# Patient Record
Sex: Female | Born: 1985 | Race: Black or African American | Hispanic: No | Marital: Single | Smoking: Former smoker
Health system: Southern US, Community
[De-identification: ages and names within clinical notes are randomized; demographics above are authoritative.]

## PROBLEM LIST (undated history)

## (undated) ENCOUNTER — Inpatient Hospital Stay: Payer: Self-pay

## (undated) DIAGNOSIS — Z8659 Personal history of other mental and behavioral disorders: Secondary | ICD-10-CM

## (undated) DIAGNOSIS — Z8619 Personal history of other infectious and parasitic diseases: Secondary | ICD-10-CM

## (undated) DIAGNOSIS — Z87440 Personal history of urinary (tract) infections: Secondary | ICD-10-CM

## (undated) DIAGNOSIS — Z87898 Personal history of other specified conditions: Secondary | ICD-10-CM

## (undated) DIAGNOSIS — A749 Chlamydial infection, unspecified: Secondary | ICD-10-CM

## (undated) HISTORY — DX: Personal history of other specified conditions: Z87.898

## (undated) HISTORY — DX: Personal history of other infectious and parasitic diseases: Z86.19

## (undated) HISTORY — DX: Personal history of other mental and behavioral disorders: Z86.59

## (undated) HISTORY — DX: Chlamydial infection, unspecified: A74.9

## (undated) HISTORY — DX: Personal history of urinary (tract) infections: Z87.440

---

## 2010-08-10 HISTORY — PX: OTHER SURGICAL HISTORY: SHX169

## 2011-11-05 ENCOUNTER — Encounter: Payer: Self-pay | Admitting: Obstetrics & Gynecology

## 2011-11-05 ENCOUNTER — Ambulatory Visit (INDEPENDENT_AMBULATORY_CARE_PROVIDER_SITE_OTHER): Payer: Managed Care, Other (non HMO) | Admitting: Obstetrics & Gynecology

## 2011-11-05 VITALS — BP 123/77 | HR 83 | Ht 64.0 in | Wt 175.0 lb

## 2011-11-05 DIAGNOSIS — Z30430 Encounter for insertion of intrauterine contraceptive device: Secondary | ICD-10-CM

## 2011-11-05 DIAGNOSIS — Z3009 Encounter for other general counseling and advice on contraception: Secondary | ICD-10-CM

## 2011-11-05 DIAGNOSIS — Z975 Presence of (intrauterine) contraceptive device: Secondary | ICD-10-CM

## 2011-11-05 DIAGNOSIS — Z01812 Encounter for preprocedural laboratory examination: Secondary | ICD-10-CM

## 2011-11-05 DIAGNOSIS — R635 Abnormal weight gain: Secondary | ICD-10-CM

## 2011-11-05 LAB — POCT URINE PREGNANCY: Preg Test, Ur: NEGATIVE

## 2011-11-05 NOTE — Progress Notes (Signed)
  Subjective:    Patient ID: Janice Goodman, female    DOB: 1986-03-17, 26 y.o.   MRN: [id number]  HPI  Janice Goodman is a 26 yo S AA G0 who is here to discuss birth control. She has used depo provera off and on for the past 4 years. She has gained 30 pounds in the past year. Her depo is due today and she would like to change to [place].  Review of Systems She has been monogamous for 5 months. 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 She has completed the Gardasil vaccinations. Her pap is due.    Objective:   Physical Exam  NSSA, NT, mobile, no adnexal masses  UPT negative, questions answered, consent signed, cervix prepped with betadine, anterior lip of cervix grasped with single tooth tenaculum, Mirena placed without difficulty, strings cut to 3 cm. She tolerated the procedure well.    Assessment & Plan:  Birth control- Mirena Weight gain- check TSH Preventative care- schedule annual 1 month

## 2011-11-05 NOTE — Progress Notes (Signed)
Last Depo August 17, 2011,  She did have a physical with pap in October but there was insufficient cells, so she does need a repeat.  She would rather not do that today.

## 2011-11-05 NOTE — Progress Notes (Signed)
Addended by: Barbara Cower on: 11/05/2011 11:29 AM   Modules accepted: Orders

## 2011-12-01 ENCOUNTER — Ambulatory Visit (INDEPENDENT_AMBULATORY_CARE_PROVIDER_SITE_OTHER): Payer: Managed Care, Other (non HMO) | Admitting: Obstetrics & Gynecology

## 2011-12-01 ENCOUNTER — Encounter: Payer: Self-pay | Admitting: Obstetrics & Gynecology

## 2011-12-01 VITALS — BP 121/67 | HR 85 | Ht 64.0 in | Wt 181.0 lb

## 2011-12-01 DIAGNOSIS — Z124 Encounter for screening for malignant neoplasm of cervix: Secondary | ICD-10-CM

## 2011-12-01 DIAGNOSIS — Z01419 Encounter for gynecological examination (general) (routine) without abnormal findings: Secondary | ICD-10-CM

## 2011-12-01 DIAGNOSIS — Z113 Encounter for screening for infections with a predominantly sexual mode of transmission: Secondary | ICD-10-CM

## 2011-12-01 DIAGNOSIS — Z0000 Encounter for general adult medical examination without abnormal findings: Secondary | ICD-10-CM

## 2011-12-01 NOTE — Progress Notes (Signed)
Patient ID: Mechele Dawley, female   DOB: November 22, 1985, 26 y.o.   MRN: [id number] Subjective:    Jady Braggs is a 26 y.o. female who presents for an annual exam. She has had some cramping and irregular bleeding since getting the Mirena placed last month. She has not tried IBU yet but is willing to try this. The patient is sexually active. GYN screening history: last pap: was normal. The patient wears seatbelts: yes. The patient participates in regular exercise: yes. Has the patient ever been transfused or tattooed?: yes  The patient reports that there is not domestic violence in her life.   Menstrual History: OB History    Grav Para Term Preterm Abortions TAB SAB Ect Mult Living                  Menarche age: 31 Patient's last menstrual period was 08/13/2011.    The following portions of the patient's history were reviewed and updated as appropriate: allergies, current medications, past family history, past medical history, past social history, past surgical history and problem list.  Review of Systems A comprehensive review of systems was negative.    Objective:    BP 121/67  Pulse 85  Ht 5\' 4"  (1.626 m)  Wt 181 lb (82.101 kg)  BMI 31.07 kg/m2  LMP 08/13/2011  General Appearance:    Alert, cooperative, no distress, appears stated age  Head:    Normocephalic, without obvious abnormality, atraumatic  Eyes:    PERRL, conjunctiva/corneas clear, EOM's intact, fundi    benign, both eyes  Ears:    Normal TM's and external ear canals, both ears  Nose:   Nares normal, septum midline, mucosa normal, no drainage    or sinus tenderness  Throat:   Lips, mucosa, and tongue normal; teeth and gums normal  Neck:   Supple, symmetrical, trachea midline, no adenopathy;    thyroid:  no enlargement/tenderness/nodules; no carotid   bruit or JVD  Back:     Symmetric, no curvature, ROM normal, no CVA tenderness  Lungs:     Clear to auscultation bilaterally, respirations unlabored  Chest Wall:    No  tenderness or deformity   Heart:    Regular rate and rhythm, S1 and S2 normal, no murmur, rub   or gallop  Breast Exam:    No tenderness, masses, or nipple abnormality  Abdomen:     Soft, non-tender, bowel sounds active all four quadrants,    no masses, no organomegaly  Genitalia:    Normal female without lesion, discharge or tenderness  Rectal:    Normal tone, normal prostate, no masses or tenderness;   guaiac negative stool  Extremities:   Extremities normal, atraumatic, no cyanosis or edema  Pulses:   2+ and symmetric all extremities  Skin:   Skin color, texture, turgor normal, no rashes or lesions  Lymph nodes:   Cervical, supraclavicular, and axillary nodes normal  Neurologic:   CNII-XII intact, normal strength, sensation and reflexes    throughout  .    Assessment:    Healthy female exam.    Plan:     Pap smear.

## 2012-05-19 ENCOUNTER — Telehealth: Payer: Self-pay | Admitting: *Deleted

## 2012-05-19 DIAGNOSIS — B379 Candidiasis, unspecified: Secondary | ICD-10-CM

## 2012-05-19 MED ORDER — FLUCONAZOLE 150 MG PO TABS: 150.0000 mg | ORAL_TABLET | Freq: Once | ORAL | Status: DC

## 2012-05-19 NOTE — Telephone Encounter (Signed)
Patient is having a thick clumpy discharge and irritation.  She would like Diflucan called in for her.

## 2012-05-31 ENCOUNTER — Encounter: Payer: Self-pay | Admitting: Obstetrics & Gynecology

## 2012-05-31 ENCOUNTER — Ambulatory Visit (INDEPENDENT_AMBULATORY_CARE_PROVIDER_SITE_OTHER): Payer: Managed Care, Other (non HMO) | Admitting: Obstetrics & Gynecology

## 2012-05-31 VITALS — BP 98/70 | HR 77 | Ht 63.0 in | Wt 192.0 lb

## 2012-05-31 DIAGNOSIS — R102 Pelvic and perineal pain: Secondary | ICD-10-CM

## 2012-05-31 DIAGNOSIS — Z23 Encounter for immunization: Secondary | ICD-10-CM

## 2012-05-31 DIAGNOSIS — N949 Unspecified condition associated with female genital organs and menstrual cycle: Secondary | ICD-10-CM

## 2012-05-31 DIAGNOSIS — E669 Obesity, unspecified: Secondary | ICD-10-CM

## 2012-05-31 NOTE — Progress Notes (Signed)
  Subjective:    Patient ID: Janice Goodman, female    DOB: 20-Jan-1986, 26 y.o.   MRN: [id number]  HPI  26 yo S lady who had a Mirena placed 3/13. She comes today with several month's h/o pelvic discomfort. She tells me that she had had ovarian cysts in the past and is concerned that this may be the cause of her pain. Her other complaint is that of weight gain. Her TSH was normal recently.  Review of Systems She has monthly periods lasting 2 days.    Objective:   Physical Exam        Assessment & Plan:  Pelvic discomfort- check cervical cultures and a pelvic u/s. She would like to have a nutritionist consult.

## 2012-06-01 ENCOUNTER — Other Ambulatory Visit: Payer: Self-pay | Admitting: Obstetrics & Gynecology

## 2012-06-10 ENCOUNTER — Ambulatory Visit (HOSPITAL_COMMUNITY)
Admission: RE | Admit: 2012-06-10 | Discharge: 2012-06-10 | Disposition: A | Discharge status: Home / Self Care | Payer: Managed Care, Other (non HMO) | Source: Ambulatory Visit | Attending: Obstetrics & Gynecology | Admitting: Obstetrics & Gynecology

## 2012-06-10 DIAGNOSIS — N83209 Unspecified ovarian cyst, unspecified side: Secondary | ICD-10-CM | POA: Insufficient documentation

## 2012-06-10 DIAGNOSIS — R102 Pelvic and perineal pain: Secondary | ICD-10-CM

## 2012-06-10 DIAGNOSIS — N949 Unspecified condition associated with female genital organs and menstrual cycle: Secondary | ICD-10-CM | POA: Insufficient documentation

## 2012-06-10 DIAGNOSIS — Z30431 Encounter for routine checking of intrauterine contraceptive device: Secondary | ICD-10-CM | POA: Insufficient documentation

## 2012-06-16 ENCOUNTER — Ambulatory Visit (INDEPENDENT_AMBULATORY_CARE_PROVIDER_SITE_OTHER): Payer: Managed Care, Other (non HMO) | Admitting: Obstetrics & Gynecology

## 2012-06-16 ENCOUNTER — Encounter: Payer: Self-pay | Admitting: Obstetrics & Gynecology

## 2012-06-16 VITALS — BP 125/82 | HR 90 | Ht 64.0 in | Wt 192.0 lb

## 2012-06-16 DIAGNOSIS — T8389XA Other specified complication of genitourinary prosthetic devices, implants and grafts, initial encounter: Secondary | ICD-10-CM

## 2012-06-16 DIAGNOSIS — N898 Other specified noninflammatory disorders of vagina: Secondary | ICD-10-CM

## 2012-06-16 DIAGNOSIS — N949 Unspecified condition associated with female genital organs and menstrual cycle: Secondary | ICD-10-CM

## 2012-06-16 DIAGNOSIS — T8332XA Displacement of intrauterine contraceptive device, initial encounter: Secondary | ICD-10-CM

## 2012-06-16 DIAGNOSIS — N738 Other specified female pelvic inflammatory diseases: Secondary | ICD-10-CM

## 2012-06-16 DIAGNOSIS — N739 Female pelvic inflammatory disease, unspecified: Secondary | ICD-10-CM

## 2012-06-16 DIAGNOSIS — Z30432 Encounter for removal of intrauterine contraceptive device: Secondary | ICD-10-CM

## 2012-06-16 DIAGNOSIS — R102 Pelvic and perineal pain: Secondary | ICD-10-CM

## 2012-06-16 LAB — POCT URINALYSIS DIPSTICK
Bilirubin, UA: NEGATIVE
Glucose, UA: NEGATIVE
Leukocytes, UA: NEGATIVE
Nitrite, UA: NEGATIVE
pH, UA: 6

## 2012-06-16 MED ORDER — CEFTRIAXONE SODIUM 1 G IJ SOLR
250.0000 mg | Freq: Once | INTRAMUSCULAR | Status: AC
  Administered 2012-06-16: 250 mg via INTRAMUSCULAR

## 2012-06-16 MED ORDER — FLUCONAZOLE 150 MG PO TABS: 150.0000 mg | ORAL_TABLET | Freq: Once | ORAL | Status: DC

## 2012-06-16 MED ORDER — METRONIDAZOLE 500 MG PO TABS: 500.0000 mg | ORAL_TABLET | Freq: Two times a day (BID) | ORAL | Status: AC

## 2012-06-16 MED ORDER — LIDOCAINE HCL 1 % IJ SOLN: 250.0000 mg | Freq: Once | INTRAMUSCULAR | Status: DC

## 2012-06-16 MED ORDER — DOXYCYCLINE HYCLATE 100 MG PO CAPS: 100.0000 mg | ORAL_CAPSULE | Freq: Two times a day (BID) | ORAL | Status: DC

## 2012-06-16 NOTE — Progress Notes (Signed)
History:  26 y.o. G0 here today for acute pelvic pain. She had Mirena placed in 11/05/11 and was having a lot of pain after placement, worsening in the last few weeks. She underwent ultrasound on 06/10/12 that showed a malpositioned IUD.  She is here for evaluation and management of her pelvic pain and malpositioned IUD. Reports having fevers and chills, and severe lower abdominal and pelvic pain at home. No GI/GU symptoms.  The following portions of the patient's history were reviewed and updated as appropriate: allergies, current medications, past family history, past medical history, past social history, past surgical history and problem list.  Review of Systems:  Pertinent items are noted in HPI.  Objective:  Physical Exam Blood pressure 125/82, pulse 90, height 5\' 4"  (1.626 m), weight 192 lb (87.091 kg). Temp 98.7 (took NSAID at home) Gen: NAD Abd: Soft, moderately tender to palpation, no rebound or guarding, nondistended Pelvic: Normal appearing external genitalia; normal appearing vaginal mucosa and cervix. IUD strings visualized.  Copious white discharge, wet prep sample obtained.  Small uterus, no other palpable masses, moderate uterine tenderness  IUD Removal  Patient was in the dorsal lithotomy position, normal external genitalia was noted.  A speculum was placed in the patient's vagina, normal discharge was noted, no lesions. The multiparous cervix was visualized, no lesions, no abnormal discharge;  and the cervix was swabbed with Betadine using scopettes. The strings of the IUD were grasped and pulled using ring forceps.  The IUD was successfully removed in its entirety.  Patient tolerated the procedure well.    Labs and Imaging 06/17/12 UA negative  06/10/2012  TRANSABDOMINAL AND TRANSVAGINAL ULTRASOUND OF PELVIS Clinical Data: Pelvic pain.  History of ovarian cyst.  IUD.    Technique:  Both transabdominal and transvaginal ultrasound examinations of the pelvis were performed.   Transabdominal technique was performed for global imaging of the pelvis including uterus, ovaries, adnexal regions, and pelvic cul-de-sac.  It was necessary to proceed with endovaginal exam following the transabdominal exam to visualize the IUD and left ovarian cyst.  Comparison:  None.  Findings: Uterus:  6.9 x 2.9 x 4.5 cm.  No fibroids or other uterine mass identified.  Endometrium: Double layer thickness measures 3 mm transvaginally. An IUD is seen in the endocervical canal and lower portion of the endometrial cavity, with probable penetration of the side arms into the myometrium in the lower uterine segment.  Right ovary: 3.7 x 2.2 x 1.9 cm.  Normal appearance.  Left ovary: 4.1 x 3.1 x 4.1 cm.  A simple appearing cyst measuring 3.5 cm in maximum diameter, which has benign features and most likely represents a physiologic ovarian cyst.  Other Findings:  No free fluid  IMPRESSION:  1.  Malpositioned IUD in the lower uterine segment. 2. 3.5 cm left ovarian cyst with benign features.  No further imaging followup is required in a reproductive age female. This recommendation follows the consensus statement:  Management of Asymptomatic Ovarian and Other Adnexal Cysts Imaged at [place]:  Society of Radiologists in Ultrasound Consensus Conference Statement. Radiology 2010; [phone].   Original Report Authenticated By: Myles Rosenthal, M.D.     Assessment & Plan:  IUD removed. Sample of Lo Lo-estrin given for contraception for now, will return in a few weeks for further discussion Treated for pelvic infection using Rocephin 250mg  IM x 1, 14 day course of Doxycyline and Metronidazole Also gave Diflucan Rx, she gets yeast infections after antibiotic regimens Fever and pain precautions reviewed Return to clinic  in 4 weeks for further management or earlier if needed.

## 2012-06-16 NOTE — Patient Instructions (Signed)
Return to clinic for any scheduled appointments or for any gynecologic concerns as needed.   

## 2012-06-16 NOTE — Addendum Note (Signed)
Addended by: Barbara Cower on: 06/16/2012 01:38 PM   Modules accepted: Orders

## 2012-06-16 NOTE — Progress Notes (Signed)
Patient started hurting this morning all over, achey feeling, feels chills, having significant lower pelvic cramping more in the middle.

## 2012-06-17 ENCOUNTER — Ambulatory Visit (HOSPITAL_COMMUNITY): Payer: Managed Care, Other (non HMO)

## 2012-06-17 LAB — WET PREP, GENITAL: Yeast Wet Prep HPF POC: NONE SEEN

## 2012-06-30 ENCOUNTER — Ambulatory Visit: Payer: Managed Care, Other (non HMO) | Admitting: Family Medicine

## 2012-07-11 ENCOUNTER — Ambulatory Visit: Payer: Managed Care, Other (non HMO) | Admitting: Family Medicine

## 2012-09-02 ENCOUNTER — Ambulatory Visit (INDEPENDENT_AMBULATORY_CARE_PROVIDER_SITE_OTHER): Payer: Managed Care, Other (non HMO) | Admitting: *Deleted

## 2012-09-02 DIAGNOSIS — Z3049 Encounter for surveillance of other contraceptives: Secondary | ICD-10-CM

## 2012-09-02 DIAGNOSIS — Z3042 Encounter for surveillance of injectable contraceptive: Secondary | ICD-10-CM

## 2012-09-02 MED ORDER — MEDROXYPROGESTERONE ACETATE 150 MG/ML IM SUSP
150.0000 mg | Freq: Once | INTRAMUSCULAR | Status: AC
  Administered 2012-09-02: 150 mg via INTRAMUSCULAR

## 2012-09-02 MED ORDER — PANTOPRAZOLE SODIUM 40 MG PO TBEC: 40.0000 mg | DELAYED_RELEASE_TABLET | Freq: Every day | ORAL | Status: DC

## 2012-09-02 NOTE — Progress Notes (Signed)
Patient would like to return to Depo for contraception.  She had her period last week and has not had intercourse in more than two weeks.  She is otherwise doing well.

## 2012-11-28 ENCOUNTER — Ambulatory Visit: Payer: Managed Care, Other (non HMO) | Admitting: *Deleted

## 2012-12-29 IMAGING — US US TRANSVAGINAL NON-OB
1 series · 13 of 25 positions shown · non-contrast
Comparison: None.

CLINICAL DATA: Pelvic pain.  History of ovarian cyst.  IUD.

TRANSABDOMINAL AND TRANSVAGINAL ULTRASOUND OF PELVIS
TECHNIQUE: Both transabdominal and transvaginal ultrasound
examinations of the pelvis were performed.  Transabdominal
technique was performed for global imaging of the pelvis including
uterus, ovaries, adnexal regions, and pelvic cul-de-sac.
It was necessary to proceed with endovaginal exam following the
transabdominal exam to visualize the IUD and left ovarian cyst..

[Series 1: us pelvis complete · 13 of 92 slices shown]
[im 1/92]
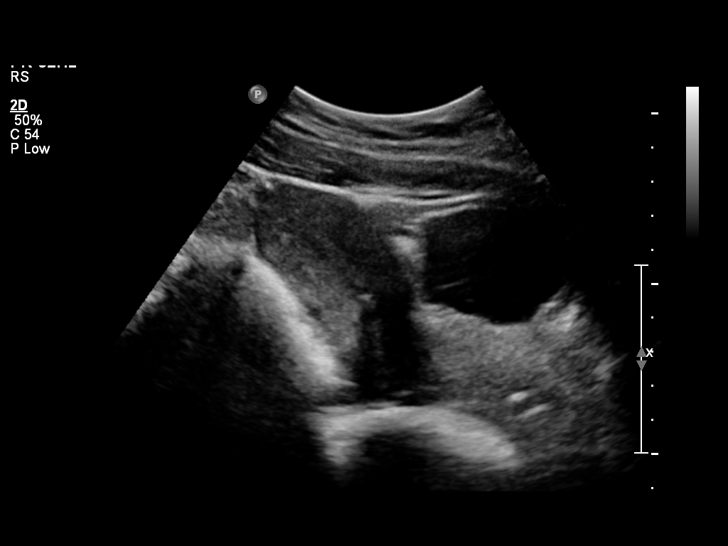
[im 8/92]
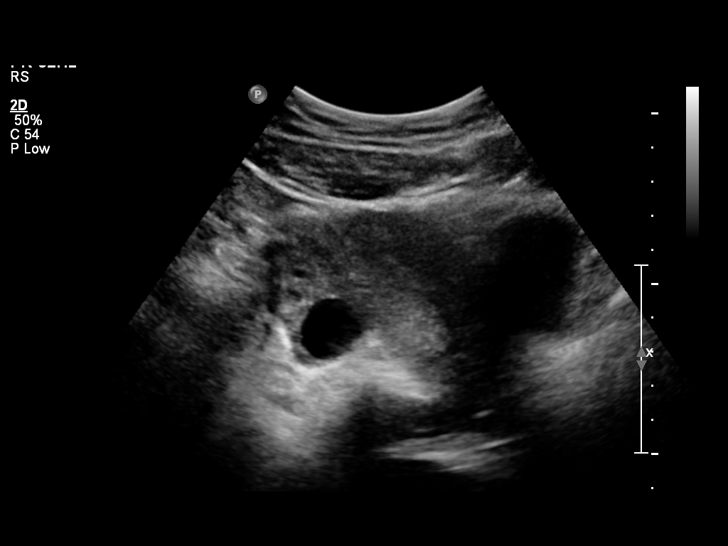
[im 16/92]
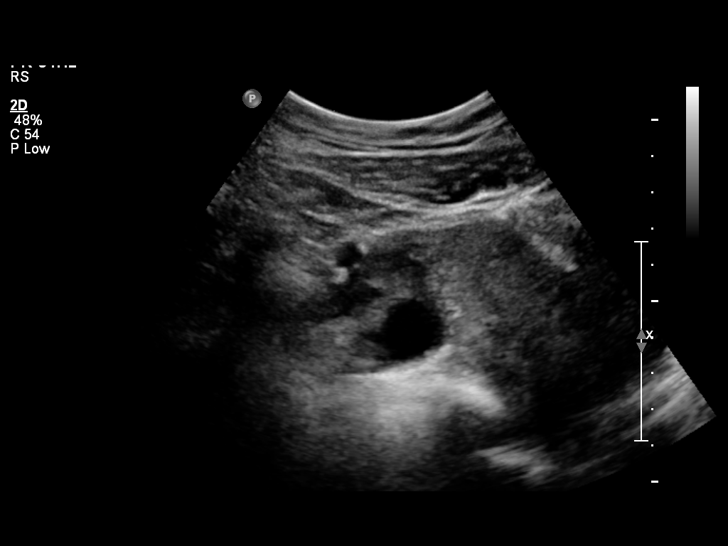
[im 23/92]
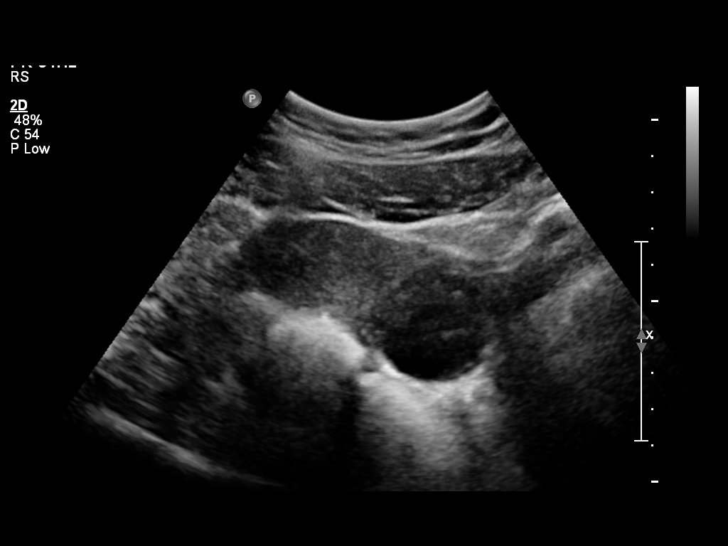
[im 31/92]
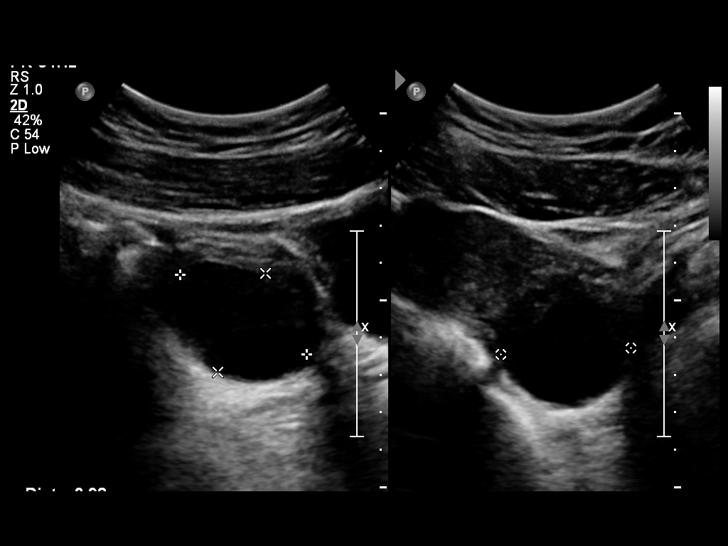
[im 38/92]
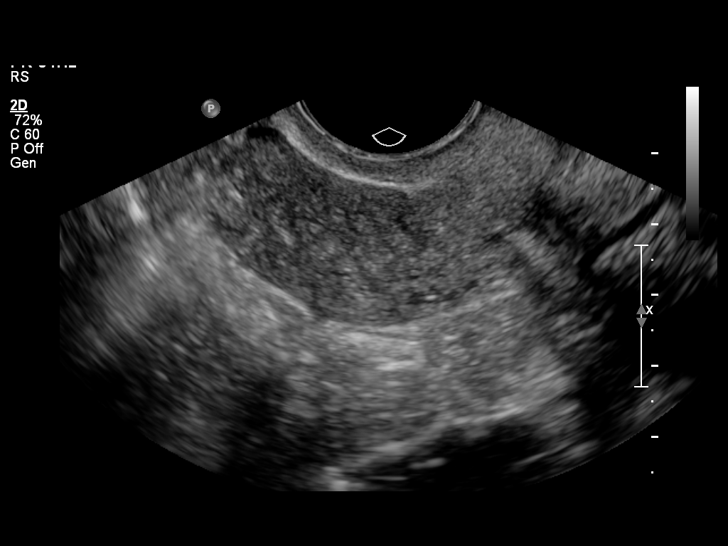
[im 46/92]
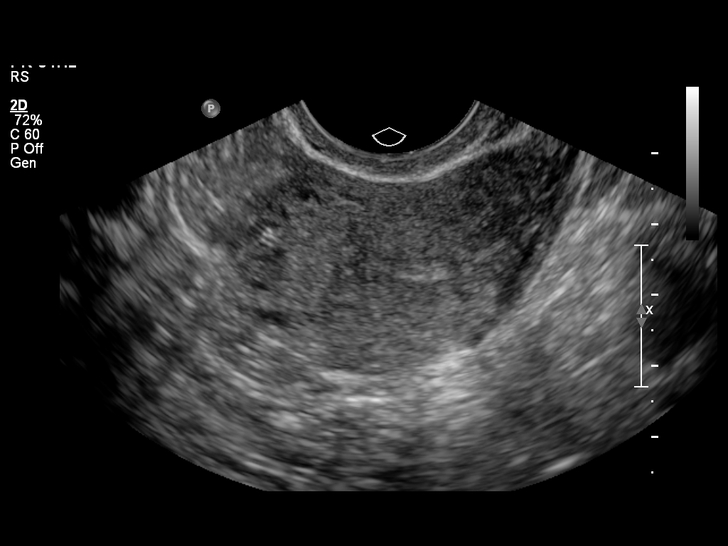
[im 54/92]
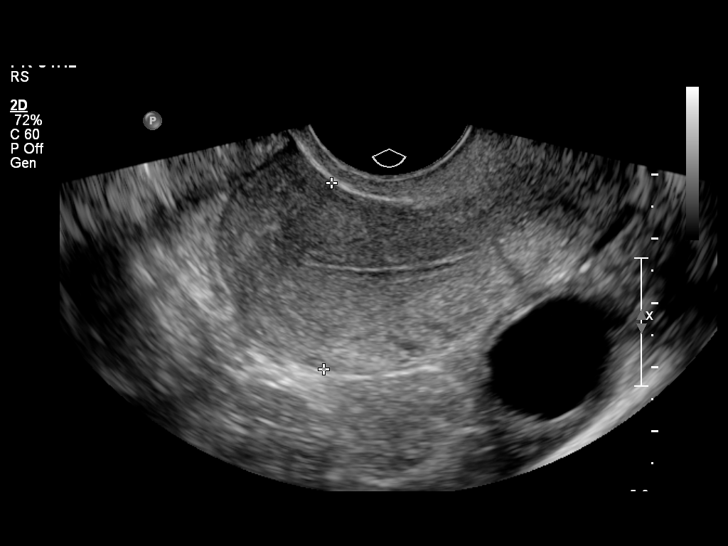
[im 61/92]
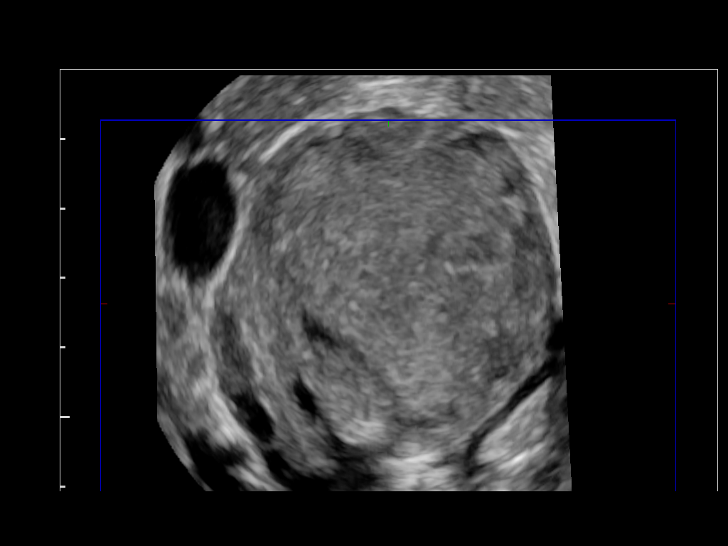
[im 69/92]
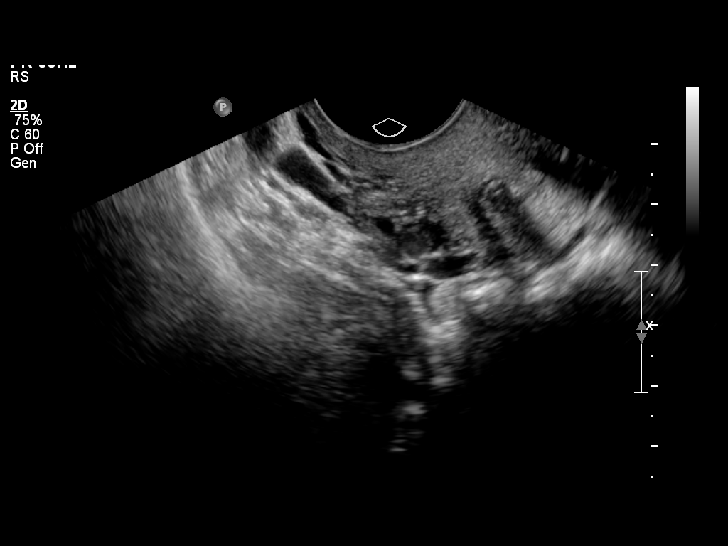
[im 76/92]
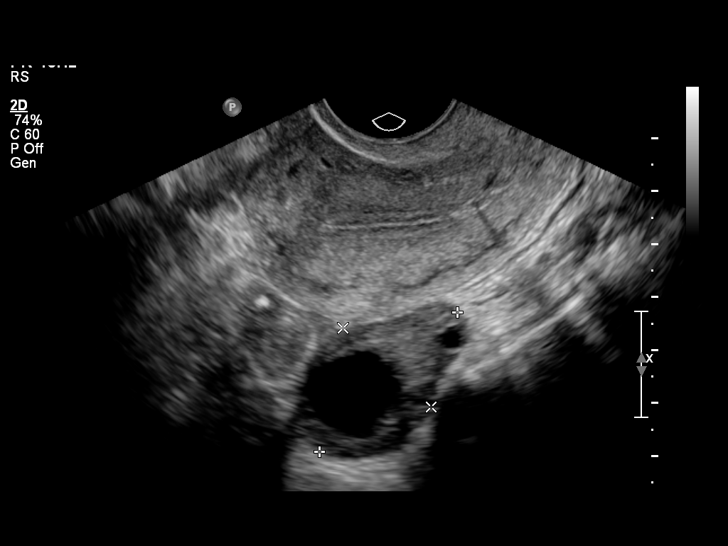
[im 84/92]
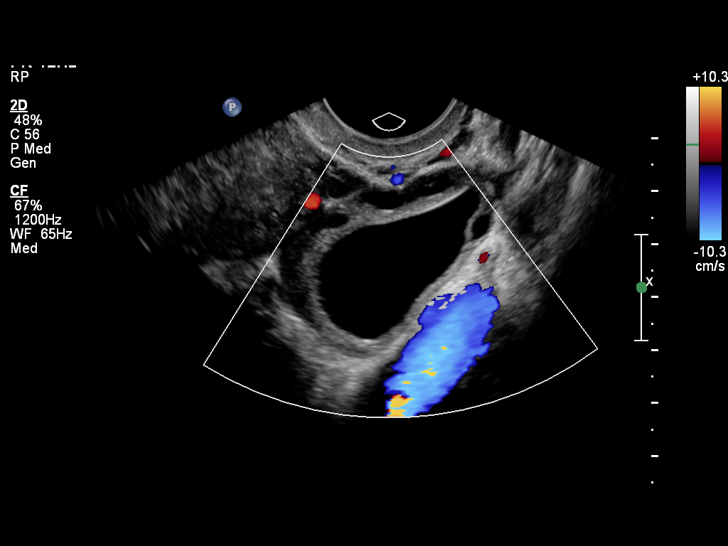
[im 92/92]
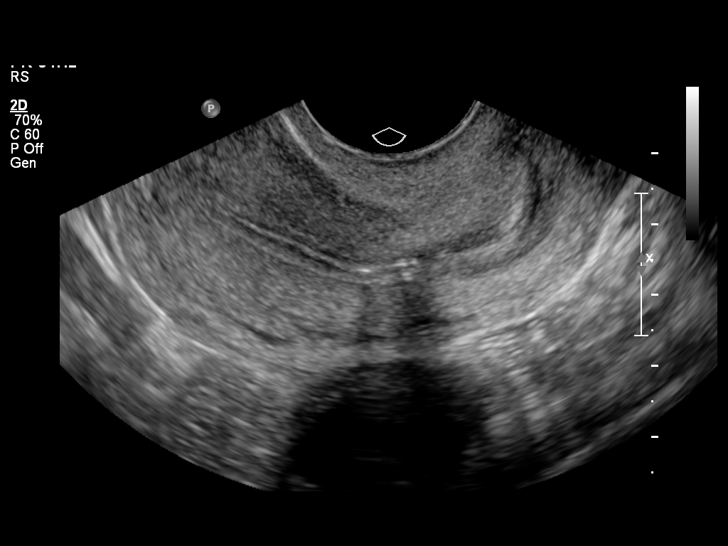

[13 of 25 positions shown; findings below may reference images not displayed]

FINDINGS: Uterus:  6.9 x 2.9 x 4.5 cm.  No fibroids or other uterine mass
identified.

Endometrium: Double layer thickness measures 3 mm transvaginally.
An IUD is seen in the endocervical canal and lower portion of the
endometrial cavity, with probable penetration of the side arms into
the myometrium in the lower uterine segment.

Right ovary: 3.7 x 2.2 x 1.9 cm.  Normal appearance.

Left ovary: 4.1 x 3.1 x 4.1 cm.  A simple appearing cyst measuring
3.5 cm in maximum diameter, which has benign features and most
likely represents a physiologic ovarian cyst.

Other Findings:  No free fluid
IMPRESSION: 1.  Malpositioned IUD in the lower uterine segment.
2. 3.5 cm left ovarian cyst with benign features.  No further
imaging followup is required in a reproductive age female. This
recommendation follows the consensus statement:  Management of
Asymptomatic Ovarian and Other Adnexal Cysts Imaged at [place]:  Society
of Radiologists in Ultrasound Consensus Conference Statement.

## 2013-03-22 ENCOUNTER — Encounter: Payer: Self-pay | Admitting: Obstetrics and Gynecology

## 2013-03-22 ENCOUNTER — Ambulatory Visit (INDEPENDENT_AMBULATORY_CARE_PROVIDER_SITE_OTHER): Payer: Managed Care, Other (non HMO) | Admitting: Obstetrics and Gynecology

## 2013-03-22 VITALS — BP 116/77 | HR 76 | Ht 64.0 in | Wt 192.0 lb

## 2013-03-22 DIAGNOSIS — N760 Acute vaginitis: Secondary | ICD-10-CM

## 2013-03-22 MED ORDER — METRONIDAZOLE 500 MG PO TABS: 500.0000 mg | ORAL_TABLET | Freq: Two times a day (BID) | ORAL | Status: DC

## 2013-03-22 NOTE — Progress Notes (Signed)
Patient ID: Janice Goodman, female   DOB: February 09, 1986, 27 y.o.   MRN: [id number] 27 yo G0 presenting today for evaluation of vaginal discharge with odor which has been present for the past 3-4 weeks. Patient is not sexually active and is not concerned about STD. Patient is otherwise doing well  GENERAL: Well-developed, well-nourished female in no acute distress.  ABDOMEN: Soft, nontender, nondistended. PELVIC: Normal external female genitalia. Vagina is pink and rugated. Thin grey discharge. Normal appearing cervix. Uterus is normal in size. No adnexal mass or tenderness. EXTREMITIES: No cyanosis, clubbing, or edema, 2+ distal pulses.  A/P 27 yo with vaginitis - Wet prep collected - Will treat for presumed BV with Flagyl - RTC prn or for annual exam

## 2013-03-24 LAB — WET PREP, GENITAL

## 2013-04-07 ENCOUNTER — Telehealth: Payer: Self-pay | Admitting: *Deleted

## 2013-04-07 DIAGNOSIS — J020 Streptococcal pharyngitis: Secondary | ICD-10-CM

## 2013-04-07 MED ORDER — AZITHROMYCIN 250 MG PO TABS: 250.0000 mg | ORAL_TABLET | Freq: Every day | ORAL | Status: DC

## 2013-04-07 NOTE — Telephone Encounter (Signed)
Patient is having sore throat, fever, chills and nausea for one week and it is not getting better.  She currently does not have a primary care and has been to both [facility] in this area and they would not see her until next week.  She will get established with a primary care so she will be able to be seen sooner for issues like this, for now we will call in medication for her and she will follow up next week to let us know how she is feeling.

## 2013-04-25 ENCOUNTER — Encounter: Payer: Self-pay | Admitting: Family Medicine

## 2013-04-25 ENCOUNTER — Ambulatory Visit (INDEPENDENT_AMBULATORY_CARE_PROVIDER_SITE_OTHER): Payer: Managed Care, Other (non HMO) | Admitting: Family Medicine

## 2013-04-25 VITALS — BP 118/60 | HR 95 | Temp 98.9°F | Ht 63.0 in | Wt 197.2 lb

## 2013-04-25 DIAGNOSIS — F32A Depression, unspecified: Secondary | ICD-10-CM | POA: Insufficient documentation

## 2013-04-25 DIAGNOSIS — F329 Major depressive disorder, single episode, unspecified: Secondary | ICD-10-CM

## 2013-04-25 DIAGNOSIS — F3289 Other specified depressive episodes: Secondary | ICD-10-CM

## 2013-04-25 DIAGNOSIS — N946 Dysmenorrhea, unspecified: Secondary | ICD-10-CM

## 2013-04-25 DIAGNOSIS — K219 Gastro-esophageal reflux disease without esophagitis: Secondary | ICD-10-CM

## 2013-04-25 MED ORDER — BUPROPION HCL ER (XL) 150 MG PO TB24: 150.0000 mg | ORAL_TABLET | Freq: Every day | ORAL | Status: DC

## 2013-04-25 NOTE — Patient Instructions (Addendum)
Start wellbutrin one pill daily in the am  Melatonin is ok for sleep  Keep exercising  See you counselor regularly  Follow up with me in about 4-6 weeks  If you have side effects that worry you - stop the medicine and let me know

## 2013-04-25 NOTE — Assessment & Plan Note (Signed)
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

## 2013-04-25 NOTE — Assessment & Plan Note (Signed)
Pt used depo provera but it caused wt gain  OC years ago  IUD- became malrotated and had to be removed Urged pt to discuss further with her gyn physician and also go for annual exam and pap

## 2013-04-25 NOTE — Assessment & Plan Note (Signed)
For years- using PPI daily  protonix currently  Disc good diet and wt loss

## 2013-04-25 NOTE — Progress Notes (Signed)
Subjective:    Patient ID: Janice Goodman, female    DOB: 03/15/1986, 27 y.o.   MRN: [id number]  HPI Here to est as a new pt   Is pretty healthy   Has seen gyn --Dr Marice Potter Pap 4/13 Hx of embedded/ malpositioned iud in the past  Is not on any birth control right now  Is not sexually active  She does have bad cramps  OC when very young  nuva ring - kept coming out  iud did not work out  Had depo shot - caused weight gain   peroids are getting regular again off depo   She eats a pretty healthy diet  She runs/ push ups and sit ups   bmi is 34- in obese range  Hx of depression- still battles at times  Was tx by [employer] physicians in the past  Has taken melatonin for sleep  Thinks she needs treatment  She used to be a cutter  She tries to distract herself from that  She no longer gets joy out of things anymore  Tends to get sad and angry at times   Has some worries and some stresses   Stressful work - 2nd shift - she does like 2nd shift  Has insomnia - hard for her to fall asleep - gets compulsive thoughts  Her military therapist says she has PTSD   Was on wellbutrin in the past -thinks it worked well  On zoloft in the past - it made her very mellow   She will be changing to [employer] - in January   Is in the [employer]- [employer] now, works for [employer]  Is here for the meantime    Hx of headaches- gets a headache 2-3 times per week  ? What triggers them  She cut out caffeine  Has a fair amt of stress   On protonix- for acid reflux - and it works pretty well  Bad heartburn without it   Flu vaccine-- already had her flu shot this season   Td= ? Last shot was -- thinks 2012  Cannot lapse on shots in the military    Patient Active Problem List   Diagnosis Date Noted  . Pelvic infection secondary to intrauterine contraception 06/16/2012   Past Medical History  Diagnosis Date  . History of chicken pox   . History of depression   . History of  headache   . History of UTI    Past Surgical History  Procedure Laterality Date  . Broken finger  2012   History  Substance Use Topics  . Smoking status: Never Smoker   . Smokeless tobacco: Not on file  . Alcohol Use: Yes     Comment: occ   Family History  Problem Relation Age of Onset  . Ovarian cysts Mother   . Cancer Paternal Grandmother     Lymphatic Cancer  . Cancer Paternal Uncle     Lymphatic Cancer   No Known Allergies Current Outpatient Prescriptions on File Prior to Visit  Medication Sig Dispense Refill  . pantoprazole (PROTONIX) 40 MG tablet Take 1 tablet (40 mg total) by mouth daily.  30 tablet  11   No current facility-administered medications on file prior to visit.     Review of Systems Review of Systems  Constitutional: Negative for fever, appetite change, fatigue and unexpected weight change.  Eyes: Negative for pain and visual disturbance.  Respiratory: Negative for cough and shortness of breath.   Cardiovascular: Negative for  cp or palpitations    Gastrointestinal: Negative for nausea, diarrhea and constipation. neg for heartburn on PPI Genitourinary: Negative for urgency and frequency.  Skin: Negative for pallor or rash   Neurological: Negative for weakness, light-headedness, numbness and headaches.  Hematological: Negative for adenopathy. Does not bruise/bleed easily.  Psychiatric/Behavioral: Negative for dysphoric mood. The patient is not anxious / neg for SI         Objective:   Physical Exam  Constitutional: She appears well-developed and well-nourished. No distress.  obese and well appearing   HENT:  Head: Normocephalic and atraumatic.  Right Ear: External ear normal.  Left Ear: External ear normal.  Nose: Nose normal.  Mouth/Throat: Oropharynx is clear and moist.  Eyes: Conjunctivae and EOM are normal. Pupils are equal, round, and reactive to light. Right eye exhibits no discharge. Left eye exhibits no discharge. No scleral icterus.   Neck: Normal range of motion. Neck supple. No JVD present. Carotid bruit is not present. No thyromegaly present.  Cardiovascular: Normal rate, regular rhythm, normal heart sounds and intact distal pulses.  Exam reveals no gallop.   Pulmonary/Chest: Effort normal and breath sounds normal. No respiratory distress. She has no wheezes. She has no rales.  Abdominal: Soft. Bowel sounds are normal. She exhibits no distension, no abdominal bruit and no mass. There is no tenderness.  Musculoskeletal: She exhibits no edema and no tenderness.  Lymphadenopathy:    She has no cervical adenopathy.  Neurological: She is alert. She has normal reflexes. She displays no tremor. No cranial nerve deficit. She exhibits normal muscle tone. Coordination normal.  Skin: Skin is warm and dry. No rash noted. No erythema. No pallor.  Psychiatric: Her behavior is normal. Judgment and thought content normal.  Gets tearful at times when discussing stressors           Assessment & Plan:

## 2013-05-23 ENCOUNTER — Telehealth: Payer: Self-pay

## 2013-05-23 ENCOUNTER — Ambulatory Visit: Payer: Managed Care, Other (non HMO) | Admitting: Family Medicine

## 2013-05-23 NOTE — Telephone Encounter (Signed)
Pt request refill Wellbutrin; pt has not cked with [facility], refills should be available. Pt will ck with pharmacy.

## 2013-05-29 ENCOUNTER — Encounter: Payer: Self-pay | Admitting: Family Medicine

## 2013-05-29 ENCOUNTER — Ambulatory Visit (INDEPENDENT_AMBULATORY_CARE_PROVIDER_SITE_OTHER): Payer: Managed Care, Other (non HMO) | Admitting: Family Medicine

## 2013-05-29 VITALS — BP 116/78 | HR 83 | Temp 98.3°F | Ht 63.0 in | Wt 197.5 lb

## 2013-05-29 DIAGNOSIS — Z8709 Personal history of other diseases of the respiratory system: Secondary | ICD-10-CM

## 2013-05-29 DIAGNOSIS — Z8701 Personal history of pneumonia (recurrent): Secondary | ICD-10-CM

## 2013-05-29 DIAGNOSIS — F329 Major depressive disorder, single episode, unspecified: Secondary | ICD-10-CM

## 2013-05-29 DIAGNOSIS — Z23 Encounter for immunization: Secondary | ICD-10-CM

## 2013-05-29 MED ORDER — BUPROPION HCL ER (XL) 300 MG PO TB24: 300.0000 mg | ORAL_TABLET | Freq: Every day | ORAL | Status: DC

## 2013-05-29 NOTE — Patient Instructions (Signed)
Increase wellbutrin xl to 300 mg once daily If any side effects of problems let me know  Follow up with me in about 3 months  Pneumonia vaccine today (this is a shot you get every 5-7 years because of your history of pneumonia)  Take care of yourself  If breathing symptoms do not improve in the spring please let me know

## 2013-05-29 NOTE — Assessment & Plan Note (Signed)
Several times with one hosp Pneumovax today utd flu vaccine

## 2013-05-29 NOTE — Assessment & Plan Note (Signed)
Improving slowly with wellbutrin Inc dose to 300 xl  F/u about 3 mo  Disc lifestyle change  Will update if side eff/ reviewed poss

## 2013-05-29 NOTE — Progress Notes (Signed)
Subjective:    Patient ID: Janice Goodman, female    DOB: November 14, 1985, 27 y.o.   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 Depression 04/25/2013  . Menses painful 04/25/2013  . GERD (gastroesophageal reflux disease) 04/25/2013  . Pelvic infection secondary to intrauterine contraception 06/16/2012   Past Medical History  Diagnosis Date  . History of chicken pox   . History of depression   . History of headache   . History of UTI    Past Surgical History  Procedure Laterality Date  . Broken finger  2012   History  Substance Use Topics  . Smoking status: Never Smoker   . Smokeless tobacco: Not on file  . Alcohol Use: Yes     Comment: occ   Family History  Problem Relation Age of Onset  . Ovarian cysts Mother   . Cancer Paternal Grandmother     Lymphatic Cancer  . Cancer Paternal Uncle     Lymphatic Cancer   No Known Allergies Current Outpatient Prescriptions on File Prior to Visit  Medication Sig Dispense Refill  . buPROPion (WELLBUTRIN XL) 150 MG 24 hr tablet Take 1 tablet (150 mg total) by mouth daily.  30 tablet  5  . pantoprazole (PROTONIX) 40 MG tablet Take  1 tablet (40 mg total) by mouth daily.  30 tablet  11   No current facility-administered medications on file prior to visit.    Review of Systems Review of Systems  Constitutional: Negative for fever, appetite change,  and unexpected weight change. pos for fatigue Eyes: Negative for pain and visual disturbance.  Respiratory: pos for cough with running - since her bronchitis in Aug , neg for wheeze    Cardiovascular: Negative for cp or palpitations    Gastrointestinal: Negative for nausea, diarrhea and constipation.  Genitourinary: Negative for urgency and frequency.  Skin: Negative for pallor or rash   Neurological: Negative for weakness, light-headedness, numbness and headaches.  Hematological: Negative for adenopathy. Does not bruise/bleed easily.  Psychiatric/Behavioral: pos for depression and anxiety-that are improved from last visit , neg for SI         Objective:   Physical Exam  Constitutional: She appears well-developed and well-nourished. No distress.  obese and well appearing   HENT:  Head: Normocephalic and atraumatic.  Mouth/Throat: Oropharynx is clear and moist.  Eyes: Conjunctivae and EOM are normal. Pupils are equal, round, and reactive to light. Right eye exhibits no discharge. Left eye exhibits no discharge.  Neck: Normal range of motion. Neck supple. No  JVD present. No thyromegaly present.  Cardiovascular: Normal rate, regular rhythm and normal heart sounds.   Pulmonary/Chest: Effort normal and breath sounds normal. No respiratory distress. She has no wheezes. She has no rales.  Good air exch    Musculoskeletal: She exhibits no edema and no tenderness.  Lymphadenopathy:    She has no cervical adenopathy.  Neurological: She is alert. She has normal reflexes. She displays no tremor. No cranial nerve deficit. She exhibits normal muscle tone. Coordination normal.  Skin: Skin is warm and dry. No rash noted. No erythema. No pallor.  Psychiatric: Her speech is normal  and behavior is normal. Thought content normal. Her affect is blunt. Thought content is not paranoid. She exhibits a depressed mood. She expresses no homicidal and no suicidal ideation.  Somewhat timid and quiet today Attentive and answers questions appropriately          Assessment & Plan:

## 2013-05-29 NOTE — Assessment & Plan Note (Signed)
Bronchitis in aug- is limited for running in [employer] for 6 mo after - note written to restrict running until march 1 If she continues to have cough with running at that time - that warrants further w/u (perhaps PFT/ imaging) Will update

## 2015-01-18 ENCOUNTER — Encounter: Payer: Self-pay | Admitting: Family Medicine

## 2015-01-18 ENCOUNTER — Ambulatory Visit (INDEPENDENT_AMBULATORY_CARE_PROVIDER_SITE_OTHER): Payer: Self-pay | Admitting: Family Medicine

## 2015-01-18 VITALS — BP 108/64 | HR 88 | Temp 98.4°F | Ht 63.0 in | Wt 204.5 lb

## 2015-01-18 DIAGNOSIS — K219 Gastro-esophageal reflux disease without esophagitis: Secondary | ICD-10-CM

## 2015-01-18 DIAGNOSIS — F32A Depression, unspecified: Secondary | ICD-10-CM

## 2015-01-18 DIAGNOSIS — F329 Major depressive disorder, single episode, unspecified: Secondary | ICD-10-CM

## 2015-01-18 MED ORDER — PANTOPRAZOLE SODIUM 40 MG PO TBEC: 40.0000 mg | DELAYED_RELEASE_TABLET | Freq: Every day | ORAL | Status: DC

## 2015-01-18 MED ORDER — BUPROPION HCL ER (XL) 300 MG PO TB24: 300.0000 mg | ORAL_TABLET | Freq: Every day | ORAL | Status: DC

## 2015-01-18 NOTE — Progress Notes (Signed)
Subjective:    Patient ID: Janice Goodman, female    DOB: 1986-03-10, 29 y.o.   MRN: [id number]  HPI Here for f/u of chronic medical conditions   Depression Takes wellbutrin - and needs a refill  Did not take it the past 2 mo - she was moody and sad  Is mellow and much more stable when on it  No side effects - and it helps her mt her wt  In [employer] Caught bronchitis twice since October and it did stop her from running (and the 2nd bout turned into pneumonia) Was living in a damp environment - apartment - took her longer to get over  Did seek medical care and abx/prednisone and breathing tx  Had to do albuterol breathing treatment  Both epidodes took her out of commission for 3-6 mo  No asthma Not a smoker and no exp  No underlying problem with breathing    Is completely fine now-no problems   Patient Active Problem List   Diagnosis Date Noted  . History of bronchitis 05/29/2013  . History of pneumonia 05/29/2013  . Depression 04/25/2013  . Menses painful 04/25/2013  . GERD (gastroesophageal reflux disease) 04/25/2013  . Pelvic infection secondary to intrauterine contraception 06/16/2012   Past Medical History  Diagnosis Date  . History of chicken pox   . History of depression   . History of headache   . History of UTI    Past Surgical History  Procedure Laterality Date  . Broken finger  2012   History  Substance Use Topics  . Smoking status: Never Smoker   . Smokeless tobacco: Not on file  . Alcohol Use: 0.0 oz/week    0 Standard drinks or equivalent per week     Comment: occ   Family History  Problem Relation Age of Onset  . Ovarian cysts Mother   . Cancer Paternal Grandmother     Lymphatic Cancer  . Cancer Paternal Uncle     Lymphatic Cancer   No Known Allergies Current Outpatient Prescriptions on File Prior to Visit  Medication Sig Dispense Refill  . buPROPion (WELLBUTRIN XL) 300 MG 24 hr tablet Take 1 tablet (300 mg total) by mouth  daily. 30 tablet 11  . pantoprazole (PROTONIX) 40 MG tablet Take 1 tablet (40 mg total) by mouth daily. 30 tablet 11   No current facility-administered medications on file prior to visit.       Eating healthy  Exercise - regularly   GERD- is better (no longer regurgitating) , she takes protonix  Watches intake  She had to quit fast food or greasy food  Does not overeat  Also stays away from soda   Wt is up 7 lb with bmi of 36  Patient Active Problem List   Diagnosis Date Noted  . History of bronchitis 05/29/2013  . History of pneumonia 05/29/2013  . Depression 04/25/2013  . Menses painful 04/25/2013  . GERD (gastroesophageal reflux disease) 04/25/2013  . Pelvic infection secondary to intrauterine contraception 06/16/2012   Past Medical History  Diagnosis Date  . History of chicken pox   . History of depression   . History of headache   . History of UTI    Past Surgical History  Procedure Laterality Date  . Broken finger  2012   History  Substance Use Topics  . Smoking status: Never Smoker   . Smokeless tobacco: Not on file  . Alcohol Use: 0.0 oz/week    0 Standard drinks  or equivalent per week     Comment: occ   Family History  Problem Relation Age of Onset  . Ovarian cysts Mother   . Cancer Paternal Grandmother     Lymphatic Cancer  . Cancer Paternal Uncle     Lymphatic Cancer   No Known Allergies No current outpatient prescriptions on file prior to visit.   No current facility-administered medications on file prior to visit.    Review of Systems Review of Systems  Constitutional: Negative for fever, appetite change, fatigue and unexpected weight change.  Eyes: Negative for pain and visual disturbance.  Respiratory: Negative for cough and shortness of breath.   Cardiovascular: Negative for cp or palpitations    Gastrointestinal: Negative for nausea, diarrhea and constipation.  Genitourinary: Negative for urgency and frequency.  Skin: Negative for  pallor or rash   Neurological: Negative for weakness, light-headedness, numbness and headaches.  Hematological: Negative for adenopathy. Does not bruise/bleed easily.  Psychiatric/Behavioral: pos for recent  for dysphoric mood. The patient is not nervous/anxious.  neg for SI       Objective:   Physical Exam  Constitutional: She appears well-developed and well-nourished. No distress.  obese and well appearing   HENT:  Head: Normocephalic and atraumatic.  Mouth/Throat: Oropharynx is clear and moist.  Eyes: Conjunctivae and EOM are normal. Pupils are equal, round, and reactive to light.  Neck: Normal range of motion. Neck supple. No JVD present. Carotid bruit is not present. No thyromegaly present.  Cardiovascular: Normal rate, regular rhythm, normal heart sounds and intact distal pulses.  Exam reveals no gallop.   Pulmonary/Chest: Effort normal and breath sounds normal. No respiratory distress. She has no wheezes. She has no rales.  No crackles  Abdominal: Soft. Bowel sounds are normal. She exhibits no distension, no abdominal bruit and no mass. There is no tenderness.  Musculoskeletal: She exhibits no edema.  Lymphadenopathy:    She has no cervical adenopathy.  Neurological: She is alert. She has normal reflexes.  Skin: Skin is warm and dry. No rash noted.  Psychiatric: She has a normal mood and affect.  Mood is fairly stable today-pt is talkative and shares stressors           Assessment & Plan:   Problem List Items Addressed This Visit    Depression    Pt tried d/c the wellbutrin and her sadness/teafullness returned  Would like to go back on it  Px written Offered counseling ref for stress at any time       Relevant Medications   buPROPion (WELLBUTRIN XL) 300 MG 24 hr tablet   GERD (gastroesophageal reflux disease) - Primary    Doing well with protonix-no changes  Symptoms return if she stops it  Disc GERD diet and habits Refilled for the year       Relevant  Medications   pantoprazole (PROTONIX) 40 MG tablet

## 2015-01-18 NOTE — Patient Instructions (Signed)
I wrote px for the year  Take care of yourself Get back to regular activity Lungs sound good - if you have more problems please let me know I wrote a note releasing you to full duty

## 2015-01-18 NOTE — Progress Notes (Signed)
Pre visit review using our clinic review tool, if applicable. No additional management support is needed unless otherwise documented below in the visit note. 

## 2015-01-20 NOTE — Assessment & Plan Note (Signed)
Doing well with protonix-no changes  Symptoms return if she stops it  Disc GERD diet and habits Refilled for the year

## 2015-01-20 NOTE — Assessment & Plan Note (Signed)
Pt tried d/c the wellbutrin and her sadness/teafullness returned  Would like to go back on it  Px written Offered counseling ref for stress at any time

## 2015-01-26 ENCOUNTER — Other Ambulatory Visit (HOSPITAL_COMMUNITY)
Admission: RE | Admit: 2015-01-26 | Discharge: 2015-01-26 | Disposition: A | Discharge status: Home / Self Care | Payer: Self-pay | Source: Ambulatory Visit | Attending: Family Medicine | Admitting: Family Medicine

## 2015-01-26 ENCOUNTER — Encounter (HOSPITAL_COMMUNITY): Payer: Self-pay | Admitting: *Deleted

## 2015-01-26 ENCOUNTER — Emergency Department (INDEPENDENT_AMBULATORY_CARE_PROVIDER_SITE_OTHER)
Admission: EM | Admit: 2015-01-26 | Discharge: 2015-01-26 | Disposition: A | Discharge status: Home / Self Care | Payer: Self-pay | Source: Home / Self Care | Attending: Family Medicine | Admitting: Family Medicine

## 2015-01-26 DIAGNOSIS — Z202 Contact with and (suspected) exposure to infections with a predominantly sexual mode of transmission: Secondary | ICD-10-CM

## 2015-01-26 DIAGNOSIS — N760 Acute vaginitis: Secondary | ICD-10-CM | POA: Insufficient documentation

## 2015-01-26 DIAGNOSIS — Z113 Encounter for screening for infections with a predominantly sexual mode of transmission: Secondary | ICD-10-CM | POA: Insufficient documentation

## 2015-01-26 LAB — POCT URINALYSIS DIP (DEVICE)
Bilirubin Urine: NEGATIVE
Glucose, UA: NEGATIVE mg/dL
Hgb urine dipstick: NEGATIVE
KETONES UR: NEGATIVE mg/dL
LEUKOCYTES UA: NEGATIVE
NITRITE: NEGATIVE
PROTEIN: NEGATIVE mg/dL
Specific Gravity, Urine: 1.02 (ref 1.005–1.030)
Urobilinogen, UA: 0.2 mg/dL (ref 0.0–1.0)
pH: 6.5 (ref 5.0–8.0)

## 2015-01-26 LAB — POCT PREGNANCY, URINE: Preg Test, Ur: NEGATIVE

## 2015-01-26 MED ORDER — CEFTRIAXONE SODIUM 250 MG IJ SOLR
INTRAMUSCULAR | Status: AC
  Filled 2015-01-26: qty 250

## 2015-01-26 MED ORDER — AZITHROMYCIN 250 MG PO TABS
ORAL_TABLET | ORAL | Status: AC
  Filled 2015-01-26: qty 4

## 2015-01-26 MED ORDER — CEFTRIAXONE SODIUM 250 MG IJ SOLR
250.0000 mg | Freq: Once | INTRAMUSCULAR | Status: AC
  Administered 2015-01-26: 250 mg via INTRAMUSCULAR

## 2015-01-26 MED ORDER — AZITHROMYCIN 250 MG PO TABS
1000.0000 mg | ORAL_TABLET | Freq: Once | ORAL | Status: AC
  Administered 2015-01-26: 1000 mg via ORAL

## 2015-01-26 NOTE — Discharge Instructions (Signed)
We will call with positive test results and treat as indicated  °

## 2015-01-26 NOTE — ED Notes (Signed)
Pt  Reports  Low  Abdominal  Cramping     As   Well  As  A   Discharge         With  Symptoms       X  1  Week   She  States  A  Partner  Reported  He  Had    chlymydia

## 2015-01-26 NOTE — ED Provider Notes (Signed)
CSN: [id number]     Arrival date & time 01/26/15  1928 History   First MD Initiated Contact with Patient 01/26/15 2005     Chief Complaint  Patient presents with  . Exposure to STD   (Consider location/radiation/quality/duration/timing/severity/associated sxs/prior Treatment) Patient is a 29 y.o. female presenting with STD exposure. The history is provided by the patient.  Exposure to STD This is a new problem. The current episode started more than 1 week ago (unprotected exposure 1 mo ago, told today that female partner has chlamydia, , pt very anxious.). The problem has not changed since onset.Pertinent negatives include no chest pain and no abdominal pain.    Past Medical History  Diagnosis Date  . History of chicken pox   . History of depression   . History of headache   . History of UTI    Past Surgical History  Procedure Laterality Date  . Broken finger  2012   Family History  Problem Relation Age of Onset  . Ovarian cysts Mother   . Cancer Paternal Grandmother     Lymphatic Cancer  . Cancer Paternal Uncle     Lymphatic Cancer   History  Substance Use Topics  . Smoking status: Never Smoker   . Smokeless tobacco: Not on file  . Alcohol Use: 0.0 oz/week    0 Standard drinks or equivalent per week     Comment: occ   OB History    No data available     Review of Systems  Constitutional: Negative.   Cardiovascular: Negative for chest pain.  Gastrointestinal: Negative.  Negative for abdominal pain.  Genitourinary: Negative for dysuria, urgency, vaginal discharge, menstrual problem and pelvic pain.    Allergies  Review of patient's allergies indicates no known allergies.  Home Medications   Prior to Admission medications   Medication Sig Start Date End Date Taking? Authorizing Provider  buPROPion (WELLBUTRIN XL) 300 MG 24 hr tablet Take 1 tablet (300 mg total) by mouth daily. 01/18/15   Judy Pimple, MD  pantoprazole (PROTONIX) 40 MG tablet Take 1 tablet (40 mg  total) by mouth daily. 01/18/15   Marne A Tower, MD   BP 137/81 mmHg  Pulse 102  Temp(Src) 99.3 F (37.4 C) (Oral)  Resp 16  SpO2 100%  LMP 01/05/2015 Physical Exam  Constitutional: She is oriented to person, place, and time. She appears well-developed and well-nourished. She appears distressed.  Abdominal: Soft. Bowel sounds are normal.  Genitourinary: Vagina normal and uterus normal. No vaginal discharge found.  Neurological: She is alert and oriented to person, place, and time.  Skin: Skin is warm and dry.  Nursing note and vitals reviewed.   ED Course  Procedures (including critical care time) Labs Review Labs Reviewed  HIV ANTIBODY (ROUTINE TESTING)  RPR  POCT URINALYSIS DIP (DEVICE)  CERVICOVAGINAL ANCILLARY ONLY    Imaging Review No results found.   MDM   1. Exposure to STD        Linna Hoff, MD 01/26/15 2030

## 2015-01-28 LAB — RPR: RPR: NONREACTIVE

## 2015-01-28 LAB — CERVICOVAGINAL ANCILLARY ONLY
CHLAMYDIA, DNA PROBE: POSITIVE — AB
NEISSERIA GONORRHEA: NEGATIVE
Wet Prep (BD Affirm): NEGATIVE

## 2015-01-28 LAB — HIV ANTIBODY (ROUTINE TESTING W REFLEX): HIV Screen 4th Generation wRfx: NONREACTIVE

## 2015-02-19 ENCOUNTER — Telehealth: Payer: Self-pay | Admitting: Family Medicine

## 2015-02-19 NOTE — Telephone Encounter (Signed)
Received a call from Janice Goodman [phone] [employer], requesting medical records from patient.  Did not see where this had been completed, she re-faxed to my attention.  Signatures do not match.  Called pt, she stated to "go ahead and release this one time".  I explained that signatures do not match.  I requested that she e-mail fax, mail or bring in a letter of revocation specifically revoking releases and to date and sign, she is [place] and did not comment on my request.  Patient asked for phone #'s on the releases and she called both Rosette RevealV. Goodman and previous case manager Theodora BlowKathleen Roose, RN with the [facility] [employer] [facility] [employer] [phone] and left messages for them.  Patient wanted to know if I had disclosed her desire NOT to have medical records released, I told her I had not.  Waiting on call back from patient.

## 2016-07-06 ENCOUNTER — Emergency Department
Admission: EM | Admit: 2016-07-06 | Discharge: 2016-07-06 | Disposition: A | Discharge status: Home / Self Care | Payer: Self-pay | Attending: Emergency Medicine | Admitting: Emergency Medicine

## 2016-07-06 ENCOUNTER — Encounter: Payer: Self-pay | Admitting: Emergency Medicine

## 2016-07-06 ENCOUNTER — Emergency Department: Payer: Self-pay

## 2016-07-06 DIAGNOSIS — O4690 Antepartum hemorrhage, unspecified, unspecified trimester: Secondary | ICD-10-CM

## 2016-07-06 DIAGNOSIS — A749 Chlamydial infection, unspecified: Secondary | ICD-10-CM | POA: Insufficient documentation

## 2016-07-06 DIAGNOSIS — N760 Acute vaginitis: Secondary | ICD-10-CM

## 2016-07-06 DIAGNOSIS — B9689 Other specified bacterial agents as the cause of diseases classified elsewhere: Secondary | ICD-10-CM

## 2016-07-06 DIAGNOSIS — Z3A09 9 weeks gestation of pregnancy: Secondary | ICD-10-CM | POA: Insufficient documentation

## 2016-07-06 DIAGNOSIS — O98311 Other infections with a predominantly sexual mode of transmission complicating pregnancy, first trimester: Secondary | ICD-10-CM | POA: Insufficient documentation

## 2016-07-06 DIAGNOSIS — A568 Sexually transmitted chlamydial infection of other sites: Secondary | ICD-10-CM

## 2016-07-06 DIAGNOSIS — Z87891 Personal history of nicotine dependence: Secondary | ICD-10-CM | POA: Insufficient documentation

## 2016-07-06 DIAGNOSIS — N898 Other specified noninflammatory disorders of vagina: Secondary | ICD-10-CM | POA: Insufficient documentation

## 2016-07-06 DIAGNOSIS — O23591 Infection of other part of genital tract in pregnancy, first trimester: Secondary | ICD-10-CM | POA: Insufficient documentation

## 2016-07-06 LAB — URINALYSIS COMPLETE WITH MICROSCOPIC (ARMC ONLY)
BACTERIA UA: NONE SEEN
Bilirubin Urine: NEGATIVE
Glucose, UA: NEGATIVE mg/dL
Ketones, ur: NEGATIVE mg/dL
LEUKOCYTES UA: NEGATIVE
Nitrite: NEGATIVE
PH: 6 (ref 5.0–8.0)
Protein, ur: NEGATIVE mg/dL
Specific Gravity, Urine: 1.02 (ref 1.005–1.030)

## 2016-07-06 LAB — CBC
HEMATOCRIT: 38.9 % (ref 35.0–47.0)
Hemoglobin: 12.7 g/dL (ref 12.0–16.0)
MCH: 27.4 pg (ref 26.0–34.0)
MCHC: 32.6 g/dL (ref 32.0–36.0)
MCV: 83.8 fL (ref 80.0–100.0)
Platelets: 318 10*3/uL (ref 150–440)
RBC: 4.64 MIL/uL (ref 3.80–5.20)
RDW: 14 % (ref 11.5–14.5)
WBC: 10.4 10*3/uL (ref 3.6–11.0)

## 2016-07-06 LAB — CHLAMYDIA/NGC RT PCR (ARMC ONLY)
Chlamydia Tr: DETECTED — AB
N gonorrhoeae: NOT DETECTED

## 2016-07-06 LAB — COMPREHENSIVE METABOLIC PANEL
ALT: 15 U/L (ref 14–54)
AST: 20 U/L (ref 15–41)
Albumin: 3.5 g/dL (ref 3.5–5.0)
Alkaline Phosphatase: 70 U/L (ref 38–126)
Anion gap: 9 (ref 5–15)
BUN: 9 mg/dL (ref 6–20)
CHLORIDE: 105 mmol/L (ref 101–111)
CO2: 20 mmol/L — ABNORMAL LOW (ref 22–32)
Calcium: 9.3 mg/dL (ref 8.9–10.3)
Creatinine, Ser: 0.71 mg/dL (ref 0.44–1.00)
GFR calc non Af Amer: >60 mL/min (ref >60)
Glucose, Bld: 119 mg/dL — ABNORMAL HIGH (ref 65–99)
POTASSIUM: 3.2 mmol/L — AB (ref 3.5–5.1)
Sodium: 134 mmol/L — ABNORMAL LOW (ref 135–145)
Total Bilirubin: 0.3 mg/dL (ref 0.3–1.2)
Total Protein: 7.6 g/dL (ref 6.5–8.1)

## 2016-07-06 LAB — WET PREP, GENITAL
SPERM: NONE SEEN
Trich, Wet Prep: NONE SEEN
YEAST WET PREP: NONE SEEN

## 2016-07-06 LAB — LIPASE, BLOOD: Lipase: 21 U/L (ref 11–51)

## 2016-07-06 LAB — ABO/RH: ABO/RH(D): B POS

## 2016-07-06 LAB — HCG, QUANTITATIVE, PREGNANCY: HCG, BETA CHAIN, QUANT, S: 155603 m[IU]/mL — AB (ref <5)

## 2016-07-06 MED ORDER — ONDANSETRON 4 MG PO TBDP
4.0000 mg | ORAL_TABLET | Freq: Once | ORAL | Status: AC
  Administered 2016-07-06: 4 mg via ORAL
  Filled 2016-07-06: qty 1

## 2016-07-06 MED ORDER — CALCIUM CARBONATE ANTACID 500 MG PO CHEW
1.0000 | CHEWABLE_TABLET | Freq: Once | ORAL | Status: AC
  Administered 2016-07-06: 200 mg via ORAL
  Filled 2016-07-06: qty 1

## 2016-07-06 MED ORDER — POTASSIUM CHLORIDE CRYS ER 20 MEQ PO TBCR
40.0000 meq | EXTENDED_RELEASE_TABLET | Freq: Once | ORAL | Status: AC
  Administered 2016-07-06: 40 meq via ORAL
  Filled 2016-07-06: qty 2

## 2016-07-06 MED ORDER — GI COCKTAIL ~~LOC~~: 30.0000 mL | Freq: Once | ORAL | Status: DC

## 2016-07-06 MED ORDER — AZITHROMYCIN 500 MG PO TABS
1000.0000 mg | ORAL_TABLET | Freq: Once | ORAL | Status: AC
  Administered 2016-07-06: 1000 mg via ORAL
  Filled 2016-07-06: qty 2

## 2016-07-06 MED ORDER — ACETAMINOPHEN 325 MG PO TABS
650.0000 mg | ORAL_TABLET | Freq: Once | ORAL | Status: AC
  Administered 2016-07-06: 650 mg via ORAL
  Filled 2016-07-06: qty 2

## 2016-07-06 MED ORDER — METRONIDAZOLE 500 MG PO TABS: 500.0000 mg | ORAL_TABLET | Freq: Two times a day (BID) | ORAL | 0 refills | Status: DC

## 2016-07-06 NOTE — ED Triage Notes (Signed)
Vaginal bleeding as heavy as period began this am, abdominal cramping. [redacted] weeks pregnant, 1st pregnancy. States had ultrasound at MD office 2 weeks ago with stated single intrauterine pregnancy.

## 2016-07-06 NOTE — ED Notes (Signed)
States she is 8 weeks preg and started with some vaginal spotting this am   Having some pain to right lower abd    Describes pain as cramping type  Denies passing any clots at present but did have some heavier bleeding last week

## 2016-07-06 NOTE — ED Provider Notes (Signed)
[facility] Emergency Department Provider Note  ____________________________________________  Time seen: Approximately 10:46 AM  I have reviewed the triage vital signs and the nursing notes.   HISTORY  Chief Complaint Vaginal Bleeding    HPI Janice Goodman is a 30 y.o. female G1 P0 approximately [redacted] weeks pregnant by LMP and ultrasound presenting for vaginal bleeding. The patient reports that she had sexual intercourse this morning, and afterwards developed some lower abdominal cramping and pain associated with vaginal bleeding and clot passing. The pain is continued but the bleeding has improved. She also describes increased recent vaginal discharge without malodor. She underwent ultrasonography for vaginal bleeding several weeks ago during this pregnancy and reports "everything was normal." She has not established OB care in the area. No lightheadedness or syncope, no fevers/chills.   Past Medical History:  Diagnosis Date  . History of chicken pox   . History of depression   . History of headache   . History of UTI     Patient Active Problem List   Diagnosis Date Noted  . History of bronchitis 05/29/2013  . History of pneumonia 05/29/2013  . Depression 04/25/2013  . Menses painful 04/25/2013  . GERD (gastroesophageal reflux disease) 04/25/2013  . Pelvic infection secondary to intrauterine contraception (HCC) 06/16/2012    Past Surgical History:  Procedure Laterality Date  . broken finger  2012    Current Outpatient Rx  . Order #: [id number] Class: Print  . Order #: [id number] Class: Print  . Order #: [id number] Class: Print    Allergies Patient has no known allergies.  Family History  Problem Relation Age of Onset  . Ovarian cysts Mother   . Cancer Paternal Grandmother     Lymphatic Cancer  . Cancer Paternal Uncle     Lymphatic Cancer    Social History Social History  Substance Use Topics  . Smoking status: Former Games developermoker  . Smokeless  tobacco: Not on file  . Alcohol use 0.0 oz/week     Comment: occ    Review of Systems Constitutional: No fever/chills.No lightheadedness or syncope. Eyes: No visual changes. ENT: No sore throat. No congestion or rhinorrhea. Cardiovascular: Denies chest pain. Denies palpitations. Respiratory: Denies shortness of breath.  No cough. Gastrointestinal: Positive lower abdominal pain and cramping.  No nausea, no vomiting.  No diarrhea.  No constipation. Genitourinary: Negative for dysuria. Positive for vaginal bleeding. Positive for increased vaginal discharge. Musculoskeletal: Negative for back pain. Skin: Negative for rash. Neurological: Negative for headaches. No focal numbness, tingling or weakness.   10-point ROS otherwise negative.  ____________________________________________   PHYSICAL EXAM:  VITAL SIGNS: ED Triage Vitals  Enc Vitals Group     BP 07/06/16 0742 122/71     Pulse Rate 07/06/16 0742 91     Resp 07/06/16 0742 18     Temp 07/06/16 0742 98.4 F (36.9 C)     Temp Source 07/06/16 0742 Oral     SpO2 07/06/16 0742 97 %     Weight 07/06/16 0743 198 lb (89.8 kg)     Height 07/06/16 0743 5\' 4"  (1.626 m)     Head Circumference --      Peak Flow --      Pain Score 07/06/16 0743 7     Pain Loc --      Pain Edu? --      Excl. in GC? --     Constitutional: Alert and oriented. Well appearing and in no acute distress. Answers questions appropriately. Eyes: Conjunctivae are normal.  EOMI. No scleral icterus. Head: Atraumatic. Nose: No congestion/rhinnorhea. Mouth/Throat: Mucous membranes are moist.  Neck: No stridor.  Supple.   Cardiovascular: Normal rate, regular rhythm. No murmurs, rubs or gallops.  Respiratory: Normal respiratory effort.  No accessory muscle use or retractions. Lungs CTAB.  No wheezes, rales or ronchi. Gastrointestinal: Soft, and nondistended.  Minimally tender in the suprapubic region but No guarding or rebound.  No peritoneal  signs. Genitourinary: Normal-appearing external genitalia without lesions. The clitoris piercing in place without any swelling. Vaginal exam with mild yellowish discharge, small amount of scant dark blood, normal-appearing cervix, copious vaginal wall tissue. Bimanual exam is limited due to patient body habitus. I am unable to palpate her cervix. She has some minimal discomfort in the lower abdomen that is nonfocal and she relates this to having to urinate. No peritoneal signs. Musculoskeletal: No LE edema.  Neurologic:  A&Ox3.  Speech is clear.  Face and smile are symmetric.  EOMI.  Moves all extremities well. Skin:  Skin is warm, dry and intact. No rash noted. Psychiatric: Mood and affect are normal. Speech and behavior are normal.  Normal judgement.  ____________________________________________   LABS (all labs ordered are listed, but only abnormal results are displayed)  Labs Reviewed  CHLAMYDIA/NGC RT PCR ([facility] ONLY) - Abnormal; Notable for the following:       Result Value   Chlamydia Tr DETECTED (*)    All other components within normal limits  WET PREP, GENITAL - Abnormal; Notable for the following:    Clue Cells Wet Prep HPF POC PRESENT (*)    WBC, Wet Prep HPF POC FEW (*)    All other components within normal limits  COMPREHENSIVE METABOLIC PANEL - Abnormal; Notable for the following:    Sodium 134 (*)    Potassium 3.2 (*)    CO2 20 (*)    Glucose, Bld 119 (*)    All other components within normal limits  URINALYSIS COMPLETEWITH MICROSCOPIC ([facility] ONLY) - Abnormal; Notable for the following:    Color, Urine YELLOW (*)    APPearance CLEAR (*)    Hgb urine dipstick 3+ (*)    Squamous Epithelial / LPF 0-5 (*)    All other components within normal limits  HCG, QUANTITATIVE, PREGNANCY - Abnormal; Notable for the following:    hCG, Beta Chain, Quant, S 155,603 (*)    All other components within normal limits  LIPASE, BLOOD  CBC  ABO/RH    ____________________________________________  EKG  Not indicated ____________________________________________  RADIOLOGY  US Ob Comp Less 14 Wks  Result Date: 07/06/2016 CLINICAL DATA:  Vaginal bleeding and first-trimester pregnancy. EXAM: OBSTETRIC <14 WK [place] AND TRANSVAGINAL OB US TECHNIQUE: Both transabdominal and transvaginal ultrasound examinations were performed for complete evaluation of the gestation as well as the maternal uterus, adnexal regions, and pelvic cul-de-sac. Transvaginal technique was performed to assess early pregnancy. COMPARISON:  06/10/2012 FINDINGS: Intrauterine gestational sac: Single Yolk sac:  Visualized. Embryo:  Visualized. Cardiac Activity: Visualized. Heart Rate: 171  bpm CRL:  26.6  Mm   9 w   3 d                  [place] EDC: 02/05/2017 Subchorionic hemorrhage:  None visualized. Maternal uterus/adnexae: Corpus luteum on the left. No pathologic finding. Small luminal ridge seen in the right bladder base, likely also present in 2013 and related to ureter entry. No typical ureterocele appearance. IMPRESSION: Single living intrauterine pregnancy measuring 9 weeks 3 days. No adverse finding.  Electronically Signed   By: Marnee SpringJonathon  Watts M.D.   On: 07/06/2016 12:24   [place] Ob Transvaginal  Result Date: 07/06/2016 CLINICAL DATA:  Vaginal bleeding and first-trimester pregnancy. EXAM: OBSTETRIC <14 WK US AND TRANSVAGINAL OB US TECHNIQUE: Both transabdominal and transvaginal ultrasound examinations were performed for complete evaluation of the gestation as well as the maternal uterus, adnexal regions, and pelvic cul-de-sac. Transvaginal technique was performed to assess early pregnancy. COMPARISON:  06/10/2012 FINDINGS: Intrauterine gestational sac: Single Yolk sac:  Visualized. Embryo:  Visualized. Cardiac Activity: Visualized. Heart Rate: 171  bpm CRL:  26.6  Mm   9 w   3 d                  [place] EDC: 02/05/2017 Subchorionic hemorrhage:  None visualized. Maternal uterus/adnexae:  Corpus luteum on the left. No pathologic finding. Small luminal ridge seen in the right bladder base, likely also present in 2013 and related to ureter entry. No typical ureterocele appearance. IMPRESSION: Single living intrauterine pregnancy measuring 9 weeks 3 days. No adverse finding. Electronically Signed   By: Marnee SpringJonathon  Watts M.D.   On: 07/06/2016 12:24    ____________________________________________   PROCEDURES  Procedure(s) performed: None  Procedures  Critical Care performed: No ____________________________________________   INITIAL IMPRESSION / ASSESSMENT AND PLAN / ED COURSE  Pertinent labs & imaging results that were available during my care of the patient were reviewed by me and considered in my medical decision making (see chart for details).  30 y.o. G1 P0 proximally weeks pregnant presenting with vaginal bleeding after sexual intercourse, recent increase in vaginal discharge. Overall, the patient has reassuring vital signs. Her laboratory studies are also reassuring with the exception of mild hypokalemia which I have supplemented. Her urinalysis does not show any evidence of infection. Her hCG is appropriate for her stated LMP and she is Rh+, so wrote him is not necessary today. I am awaiting the results of her ultrasound, and we'll perform a pelvic examination including swabbing for vaginal infection. The differential diagnosis for the patient's bleeding include subchorionic hemorrhage, localized trauma from sexual intercourse, vaginal infection, threatened abortion. The patient does report IUP and her last ultrasound, but I do not have this report.  ----------------------------------------- 11:26 AM on 07/06/2016 -----------------------------------------  Overall, the patient continues to be hemodynamically stable and is feeling better. I'm awaiting the results of her ultrasound, and of her wet prep. I will also speak with the OB/GYN on call for us to establish care for  this patient.  ----------------------------------------- 1:25 PM on 07/06/2016 -----------------------------------------  The patient is positive for chlamydia and bacterial vaginosis. I spoke with Dr. Chauncey CruelStabler, who will follow the patient up. I have counseled the patient on follow-up HIV testing, as we do not perform this test here. The patient understands follow-up instructions and discharge precautions.  ____________________________________________  FINAL CLINICAL IMPRESSION(S) / ED DIAGNOSES  Final diagnoses:  Vaginal bleeding in pregnancy  Chlamydia trachomatis infection in mother during first trimester of pregnancy  Bacterial vaginosis    Clinical Course       NEW MEDICATIONS STARTED DURING THIS VISIT:  New Prescriptions   METRONIDAZOLE (FLAGYL) 500 MG TABLET    Take 1 tablet (500 mg total) by mouth 2 (two) times daily.      Rockne MenghiniAnne-Caroline Ryleeann Urquiza, MD 07/06/16 1328

## 2016-07-06 NOTE — Discharge Instructions (Signed)
Please take the entire course of antibiotics for bacterial vaginosis, even if you're feeling better. In the emergency Department, you were treated with azithromycin for a Chlamydia infection. Please make sure that all of your partners are tested and treated prior to resuming sexual activity. If you have Chlamydia, this also means that you are risk for HIV. Please make sure that you are tested for this, and that your partners are tested as well.  Return to the emergency department if you develop bleeding, lightheadedness or fainting, fever, abdominal pain, or any other symptoms concerning to you.

## 2016-07-06 NOTE — ED Notes (Signed)
Back from u/s  Family at bedside 

## 2016-08-10 NOTE — L&D Delivery Note (Signed)
Date/Time of delivery: 11/29/2016 4:56 PM Estimated Date of Delivery: 02/05/17 EGA: [redacted]w[redacted]d  Delivery Note At 4:56 PM a viable female was delivered via Vaginal, Spontaneous Delivery (Presentation: OP in the caul and caught by mom).  APGAR: 9 at 1 minute per CNM Jones who was in the room approximately 10 seconds following birth of baby, weight: 3 lb 1 oz (1390 g).   Placenta status: spontaneous, intact with a 3 vessel cord delivered by Jillyn Hidden who arrived in the room approximately 3 minutes following birth of baby.  Cord:  with the following complications: short.  Cord pH: NA. Placenta sent to pathology.  Patient was admitted earlier in the day for preterm contractions with cervical change since previous exam a week earlier. Pt received IV fluids, tocolytics, betamethasone, antibiotics, magnesium sulfate, and pain medicine. Several labs were also done including, UA, UCx, UDS, GC/CT. Initially the patient was comfortable and the contractions were spacing out, but then patient experienced a gush of blood shortly after 2 PM and her contractions were beginning to be more frequent. She requested pain medication and was able to continue resting until approximately 4:40 PM when she was wanting additional pain medicine. I was consulting with Dr Jean Rosenthal about the patient and about to go and assess her when I received a call that the baby had delivered and was in the bed. The RN and CNM Jones from [facility] arrived to the room within about 10 seconds of delivery. CNM Jones reports baby was OP and in the caul. She assisted baby out of membranes and was drying baby who was on mom's abdomen. She reports baby was vigorous, crying, and pink. She clamped and cut the cord at 5 PM. I arrived within 3-4 minutes of delivery and delivered the placenta at 5:03 PM. The perineum was inspected and was intact, no lacerations. Bleeding was brisk following delivery of placenta. Hemostasis was obtained with fundal massage, IV  pitocin, Rectal Cytotec- 800 mcg, and methergine IM. The nursery RN and MD arrived to the room about 3-4 minutes after delivery and took the baby to the [facility] Nurs[facility].   Anesthesia:  none Episiotomy:  none Lacerations:  none Suture Repair: NA Est. Blood Loss (mL):  800   Mom to NA.  Baby to NICU.  Tresea Mall, CNM 11/29/2016, 6:50 PM  Attending attestation: The patient began experiencing a return of more painful contractions shortly before delivering. She had quickly progressed from 3cm at my last check at 1412.  CNM Gledhill and I were discussing management plan of patient (previously noted in my notes) when she was called regarding precipitous delivery of infant.  We both rushed to the patient's room and the events unfolded as described as above.  I supervised delivery of placenta and attainment of hemostasis.   Patient to floor when ready.  Per NICU MD, baby doing well and vigorous.   Magnesium stopped immediately at delivery.  I edited the above delivery note by Jillyn Hidden and am in agreement with it as documented.  [name], MD 11/29/2016 9:18 PM

## 2016-08-14 LAB — OB RESULTS CONSOLE RUBELLA ANTIBODY, IGM: Rubella: IMMUNE

## 2016-08-14 LAB — OB RESULTS CONSOLE ANTIBODY SCREEN: Antibody Screen: NEGATIVE

## 2016-08-14 LAB — OB RESULTS CONSOLE HEPATITIS B SURFACE ANTIGEN: HEP B S AG: NEGATIVE

## 2016-08-14 LAB — OB RESULTS CONSOLE GC/CHLAMYDIA
Chlamydia: NEGATIVE
Gonorrhea: NEGATIVE

## 2016-08-14 LAB — OB RESULTS CONSOLE VARICELLA ZOSTER ANTIBODY, IGG: Varicella: IMMUNE

## 2016-08-14 LAB — OB RESULTS CONSOLE RPR: RPR: NONREACTIVE

## 2016-08-14 LAB — OB RESULTS CONSOLE HIV ANTIBODY (ROUTINE TESTING): HIV: NONREACTIVE

## 2016-08-14 LAB — OB RESULTS CONSOLE ABO/RH: RH TYPE: POSITIVE

## 2016-10-21 ENCOUNTER — Telehealth: Payer: Self-pay | Admitting: Obstetrics and Gynecology

## 2016-10-21 ENCOUNTER — Other Ambulatory Visit: Payer: Self-pay | Admitting: Certified Nurse Midwife

## 2016-10-21 DIAGNOSIS — Z3492 Encounter for supervision of normal pregnancy, unspecified, second trimester: Secondary | ICD-10-CM

## 2016-10-21 NOTE — Telephone Encounter (Signed)
Relayed to pt. sgs

## 2016-10-21 NOTE — Telephone Encounter (Signed)
PT is called due to her believing her muscus pull has come out . Pt is wanting to know if she needs to be seen. Pt is schedule for u/s and f/u tomorrow. No reports over cramping, bleeding or contraction. Please advise. [phone]

## 2016-10-21 NOTE — Telephone Encounter (Signed)
I tried calling patient on number given. Voicemail is not set up. No need to come in today if patient has an appointment tomorrow and is not having S&S of labor. Please relay to patient if she calls back. Thanks

## 2016-10-22 ENCOUNTER — Ambulatory Visit (INDEPENDENT_AMBULATORY_CARE_PROVIDER_SITE_OTHER): Payer: BLUE CROSS/BLUE SHIELD | Admitting: Certified Nurse Midwife

## 2016-10-22 ENCOUNTER — Observation Stay
Admission: EM | Admit: 2016-10-22 | Discharge: 2016-10-22 | Disposition: A | Discharge status: Home / Self Care | Payer: BLUE CROSS/BLUE SHIELD | Attending: Obstetrics & Gynecology | Admitting: Obstetrics & Gynecology

## 2016-10-22 ENCOUNTER — Ambulatory Visit: Payer: Self-pay

## 2016-10-22 ENCOUNTER — Encounter: Payer: Self-pay | Admitting: *Deleted

## 2016-10-22 ENCOUNTER — Encounter: Payer: Self-pay | Admitting: Certified Nurse Midwife

## 2016-10-22 VITALS — BP 102/58 | Wt 213.0 lb

## 2016-10-22 DIAGNOSIS — O4702 False labor before 37 completed weeks of gestation, second trimester: Principal | ICD-10-CM | POA: Insufficient documentation

## 2016-10-22 DIAGNOSIS — Z3A24 24 weeks gestation of pregnancy: Secondary | ICD-10-CM

## 2016-10-22 DIAGNOSIS — Z87891 Personal history of nicotine dependence: Secondary | ICD-10-CM | POA: Insufficient documentation

## 2016-10-22 DIAGNOSIS — O99342 Other mental disorders complicating pregnancy, second trimester: Secondary | ICD-10-CM | POA: Diagnosis not present

## 2016-10-22 DIAGNOSIS — O0992 Supervision of high risk pregnancy, unspecified, second trimester: Secondary | ICD-10-CM

## 2016-10-22 DIAGNOSIS — N898 Other specified noninflammatory disorders of vagina: Secondary | ICD-10-CM

## 2016-10-22 DIAGNOSIS — O3430 Maternal care for cervical incompetence, unspecified trimester: Secondary | ICD-10-CM

## 2016-10-22 DIAGNOSIS — O26892 Other specified pregnancy related conditions, second trimester: Secondary | ICD-10-CM

## 2016-10-22 DIAGNOSIS — F329 Major depressive disorder, single episode, unspecified: Secondary | ICD-10-CM | POA: Diagnosis not present

## 2016-10-22 DIAGNOSIS — Z3492 Encounter for supervision of normal pregnancy, unspecified, second trimester: Secondary | ICD-10-CM

## 2016-10-22 DIAGNOSIS — F331 Major depressive disorder, recurrent, moderate: Secondary | ICD-10-CM

## 2016-10-22 LAB — URINALYSIS, COMPLETE (UACMP) WITH MICROSCOPIC
Bilirubin Urine: NEGATIVE
GLUCOSE, UA: NEGATIVE mg/dL
HGB URINE DIPSTICK: NEGATIVE
Ketones, ur: NEGATIVE mg/dL
NITRITE: NEGATIVE
Protein, ur: NEGATIVE mg/dL
SPECIFIC GRAVITY, URINE: 1.025 (ref 1.005–1.030)
pH: 5 (ref 5.0–8.0)

## 2016-10-22 MED ORDER — BETAMETHASONE SOD PHOS & ACET 6 (3-3) MG/ML IJ SUSP
INTRAMUSCULAR | Status: AC
  Administered 2016-10-22: 12 mg via INTRAMUSCULAR
  Filled 2016-10-22: qty 1

## 2016-10-22 MED ORDER — BETAMETHASONE SOD PHOS & ACET 6 (3-3) MG/ML IJ SUSP
12.0000 mg | INTRAMUSCULAR | Status: DC
  Administered 2016-10-22: 12 mg via INTRAMUSCULAR

## 2016-10-22 NOTE — Progress Notes (Signed)
Would like to go back on Wellbutrin. Was on that prior to her pregnancy. Has had problems with depression since high school. Has been on other medications for depression (had night terrors on one of them), but Wellbutrin has worked the best. EPDS=20. #10 question score is 2. She has no suicidal ideation and has never tried to commit suicide, but has tried self mutilation in the past(cutting). [place]-has not see him this pregnancy. Will restart Wellbutrin 150 mgm XL. And advised to make appt with counselor. Also complained of cramping and passing a yellow mucoid discharge yesterday. No bleeding. No vulvar itching. No dysuria. Exam: Vulva-clitoral ring present, labia appear somewhat swollen Vagina: off white discharge Cervix: 1/50%/-2. Wet prep negative for hyphae, Trich, clue cells Aptima done. FFN was not done (did not know was still cramping today)-to L&D for monitoring FU anatomy scan today WNL. vtx presentation.

## 2016-10-22 NOTE — Discharge Summary (Signed)
Physician Final Progress Note  Patient ID: Janice Goodman MRN: [id number] DOB/AGE: April 27, 1986 31 y.o.  Admit date: 10/22/2016 Admitting provider: Tresea Mall, CNM Discharge date: 10/22/2016   Admission Diagnoses: Sent to L&D from clinic with abdominal cramps and cervical dilation 1cm   Discharge Diagnoses:  Active Problems:   Indication for care in labor and delivery, antepartum   Premature dilation of cervix during pregnancy, antepartum IUP at [redacted]w[redacted]d with positive fetal heart tones, irregular abdominal cramping, no change in cervix  History of Present Illness: The patient is a 31 y.o. female G1P0 at [redacted]w[redacted]d who presents from clinic with abdominal cramps, cervical dilation 1/50/-1. The patient states 2 days ago she noticed loss of mucous and she has had q 30 minute abdominal cramping since then. She denies leakage of fluid, vaginal bleeding, s/s of urinary tract infection. She admits positive fetal movement and adequate hydration.    Past Medical History:  Diagnosis Date  . History of chicken pox   . History of depression   . History of headache   . History of UTI     Past Surgical History:  Procedure Laterality Date  . broken finger  2012    No current facility-administered medications on file prior to encounter.    Current Outpatient Prescriptions on File Prior to Encounter  Medication Sig Dispense Refill  . buPROPion (WELLBUTRIN XL) 300 MG 24 hr tablet Take 1 tablet (300 mg total) by mouth daily. 30 tablet 11  . metroNIDAZOLE (FLAGYL) 500 MG tablet Take 1 tablet (500 mg total) by mouth 2 (two) times daily. (Patient not taking: Reported on 10/22/2016) 14 tablet 0  . pantoprazole (PROTONIX) 40 MG tablet Take 1 tablet (40 mg total) by mouth daily. (Patient not taking: Reported on 10/22/2016) 30 tablet 11    No Known Allergies  Social History   Social History  . Marital status: Single    Spouse name: N/A  . Number of children: N/A  . Years of education: N/A    Occupational History  . Not on file.   Social History Main Topics  . Smoking status: Former Games developer  . Smokeless tobacco: Never Used  . Alcohol use No     Comment: occ  . Drug use: No  . Sexual activity: Yes    Birth control/ protection: None   Other Topics Concern  . Not on file   Social History Narrative  . No narrative on file    Physical Exam: Temp 97.8 F (36.6 C) (Oral)   Resp 18   Ht 5\' 4"  (1.626 m)   Wt 213 lb (96.6 kg)   LMP 04/25/2016   BMI 36.56 kg/m   Gen: NAD CV: RRR Pulm: CTAB Pelvic: 1/50/-1 in office and 1/50/-2 3 hours later Toco: no contractions noted, pt admits cramping every 4 to 30 min Fetal Well Being: 150 bpm, moderate variability, 10x10 accels, variable Ext: no evidence of DVT  Consults: None  Significant Findings/ Diagnostic Studies: labs: pending  Procedures: NST  Discharge Condition: good  Disposition: 01-Home or Self Care  Diet: Regular diet  Discharge Activity: Activity as tolerated and no strenuous activity  Discharge Instructions    Discharge activity:    Complete by:  As directed    Return to hospital in 24 hours for 2nd dose of betamethasone   No strenuous activity   Discharge diet:  No restrictions    Complete by:  As directed    Notify physician for a general feeling that "something is not right"  Complete by:  As directed    Notify physician for increase or change in vaginal discharge    Complete by:  As directed    Notify physician for intestinal cramps, with or without diarrhea, sometimes described as "gas pain"    Complete by:  As directed    Notify physician for leaking of fluid    Complete by:  As directed    Notify physician for low, dull backache, unrelieved by heat or Tylenol    Complete by:  As directed    Notify physician for menstrual like cramps    Complete by:  As directed    Notify physician for pelvic pressure    Complete by:  As directed    Notify physician for uterine contractions.  These  may be painless and feel like the uterus is tightening or the baby is  "balling up"    Complete by:  As directed    Notify physician for vaginal bleeding    Complete by:  As directed    PRETERM LABOR:  Includes any of the follwing symptoms that occur between 20 - [redacted] weeks gestation.  If these symptoms are not stopped, preterm labor can result in preterm delivery, placing your baby at risk    Complete by:  As directed    Sexual Activity:      Complete by:  As directed    Pelvic rest while cramping persists     Allergies as of 10/22/2016   No Known Allergies     Medication List    STOP taking these medications   metroNIDAZOLE 500 MG tablet Commonly known as:  FLAGYL   pantoprazole 40 MG tablet Commonly known as:  PROTONIX     TAKE these medications   buPROPion 300 MG 24 hr tablet Commonly known as:  WELLBUTRIN XL Take 1 tablet (300 mg total) by mouth daily.   prenatal multivitamin Tabs tablet Take 1 tablet by mouth daily at 12 noon.     Return to hospital in 24 hours for 2nd dose of betamethasone  Follow-up Information    [facility].   Why:  go to regular scheduled prenatal appointment Contact information: [address] [place] [phone]          Total time spent taking care of this patient: 30 minutes  Signed: Tresea MallJane Gillian Kluever, CNM  10/22/2016, 12:56 PM

## 2016-10-22 NOTE — Discharge Instructions (Signed)
Preventing Preterm Birth °Preterm birth is when your baby is delivered between 20 weeks and 37 weeks of pregnancy. A full-term pregnancy lasts for at least 37 weeks. Preterm birth can be dangerous for your baby because the last few weeks of pregnancy are an important time for your baby's brain and lungs to grow. Many things can cause a baby to be born early. Sometimes the cause is not known. There are certain factors that make you more likely to experience preterm birth, such as: °· Having a previous baby born preterm. °· Being pregnant with twins or other multiples. °· Having had fertility treatment. °· Being overweight or underweight at the start of your pregnancy. °· Having any of the following during pregnancy: °¨ An infection, including a urinary tract infection (UTI) or an STI (sexually transmitted infection). °¨ High blood pressure. °¨ Diabetes. °¨ Vaginal bleeding. °· Being age 30 or older. °· Being age 30 or younger. °· Getting pregnant within 6 months of a previous pregnancy. °· Suffering extreme stress or physical or emotional abuse during pregnancy. °· Standing for long periods of time during pregnancy, such as working at a job that requires standing. °What are the risks? °The most serious risk of preterm birth is that the baby may not survive. This is more likely to happen if a baby is born before 34 weeks. Other risks and complications of preterm birth may include your baby having: °· Breathing problems. °· Brain damage that affects movement and coordination (cerebral palsy). °· Feeding difficulties. °· Vision or hearing problems. °· Infections or inflammation of the digestive tract (colitis). °· Developmental delays. °· Learning disabilities. °· Higher risk for diabetes, heart disease, and high blood pressure later in life. °What can I do to lower my risk? °Medical care °The most important thing you can do to lower your risk for preterm birth is to get routine medical care during pregnancy (prenatal  care). If you have a high risk of preterm birth, you may be referred to a health care provider who specializes in managing high-risk pregnancies (perinatologist). You may be given medicine to help prevent preterm birth. °Lifestyle changes °Certain lifestyle changes can also lower your risk of preterm birth: °· Wait at least 6 months after a pregnancy to become pregnant again. °· Try to plan pregnancy for when you are between 19 and 30 years old. °· Get to a healthy weight before getting pregnant. If you are overweight, work with your health care provider to safely lose weight. °· Do not use any products that contain nicotine or tobacco, such as cigarettes and e-cigarettes. If you need help quitting, ask your health care provider. °· Do not drink alcohol. °· Do not use drugs. °Where to find support: °For more support, consider: °· Talking with your health care provider. °· Talking with a therapist or substance abuse counselor, if you need help quitting. °· Working with a diet and nutrition specialist (dietitian) or a personal trainer to maintain a healthy weight. °· Joining a support group. °Where to find more information: °Learn more about preventing preterm birth from: °· Centers for Disease Control and Prevention: cdc.gov/reproductivehealth/maternalinfanthealth/pretermbirth.htm °· March of Dimes: marchofdimes.org/complications/premature-babies.aspx °· American Pregnancy Association: americanpregnancy.org/labor-and-birth/premature-labor °Contact a health care provider if: °· You have any of the following signs of preterm labor before 37 weeks: °¨ A change or increase in vaginal discharge. °¨ Fluid leaking from your vagina. °¨ Pressure or cramps in your lower abdomen. °¨ A backache that does not go away or gets worse. °¨   Regular tightening (contractions) in your lower abdomen. °Summary °· Preterm birth means having your baby during weeks 20-37 of pregnancy. °· Preterm birth may put your baby at risk for physical and  mental problems. °· Getting good prenatal care can help prevent preterm birth. °· You can lower your risk of preterm birth by making certain lifestyle changes, such as not smoking and not using alcohol. °This information is not intended to replace advice given to you by your health care provider. Make sure you discuss any questions you have with your health care provider. °Document Released: 09/10/2015 Document Revised: 04/04/2016 Document Reviewed: 04/04/2016 °Elsevier Interactive Patient Education © 2017 Elsevier Inc. ° °

## 2016-10-22 NOTE — Progress Notes (Signed)
f/u anatomy scan today. Pt c/o vaginal/pelvic pain. Also mucousy discharge

## 2016-10-23 ENCOUNTER — Observation Stay
Admission: RE | Admit: 2016-10-23 | Discharge: 2016-10-23 | Disposition: A | Discharge status: Home / Self Care | Payer: BLUE CROSS/BLUE SHIELD | Attending: Obstetrics and Gynecology | Admitting: Obstetrics and Gynecology

## 2016-10-23 DIAGNOSIS — Z3A25 25 weeks gestation of pregnancy: Secondary | ICD-10-CM | POA: Insufficient documentation

## 2016-10-23 DIAGNOSIS — O3432 Maternal care for cervical incompetence, second trimester: Secondary | ICD-10-CM | POA: Diagnosis not present

## 2016-10-23 DIAGNOSIS — O26892 Other specified pregnancy related conditions, second trimester: Secondary | ICD-10-CM | POA: Diagnosis present

## 2016-10-23 LAB — URINE CULTURE: CULTURE: NO GROWTH

## 2016-10-23 MED ORDER — ACETAMINOPHEN 325 MG PO TABS: 650.0000 mg | ORAL_TABLET | ORAL | Status: DC | PRN

## 2016-10-23 MED ORDER — PROGESTERONE 100 MG VA SUPP: 1.0000 | Freq: Every day | VAGINAL | 6 refills | Status: DC

## 2016-10-23 MED ORDER — BETAMETHASONE SOD PHOS & ACET 6 (3-3) MG/ML IJ SUSP
12.0000 mg | Freq: Once | INTRAMUSCULAR | Status: AC
  Administered 2016-10-23: 12 mg via INTRAMUSCULAR
  Filled 2016-10-23: qty 1

## 2016-10-23 NOTE — Discharge Summary (Signed)
See final progress note. 

## 2016-10-23 NOTE — Final Progress Note (Signed)
Physician Final Progress Note  Patient ID: Janice Goodman MRN: [id number] DOB/AGE: 12/04/1985 31 y.o.  Admit date: 10/23/2016 Admitting provider: Vena AustriaAndreas Teasia Zapf, MD Discharge date: 10/23/2016   Admission Diagnoses: Preterm cervical dilation  Discharge Diagnoses:  Active Problems:   Labor and delivery indication for care or intervention  31 y.o. G1P0 at 4091w0d noted to have preterm cervical dilation at 1933w6d, presenting today for repeat BMZ administration.  Monitored on tocometry with no evidence of contractions.  Cervix checked and remains stable at 1/50/hi.  No vaginal bleeding, no LOF, +FM.  Still some occasional cramping pain.  Given the shortening of the cervix and dilation at this early gestational age we discussed that besides modified bedrest which has limited data on affecting outcomes there is also some limited data on the use of vaginal progesterone supplementation.  She will be started on 100mg  vaginal progesterone suppository nightly.  Consults: None  Significant Findings/ Diagnostic Studies: none  Procedures: Tocometer without evidence of contractions  Discharge Condition: good  Disposition: 01-Home or Self Care  Diet: Regular diet  Discharge Activity: Activity as tolerated  Discharge Instructions    Discharge activity:  No Restrictions    Complete by:  As directed    Discharge diet:  No restrictions    Complete by:  As directed    No sexual activity restrictions    Complete by:  As directed    Notify physician for a general feeling that "something is not right"    Complete by:  As directed    Notify physician for increase or change in vaginal discharge    Complete by:  As directed    Notify physician for intestinal cramps, with or without diarrhea, sometimes described as "gas pain"    Complete by:  As directed    Notify physician for leaking of fluid    Complete by:  As directed    Notify physician for low, dull backache, unrelieved by heat or Tylenol     Complete by:  As directed    Notify physician for menstrual like cramps    Complete by:  As directed    Notify physician for pelvic pressure    Complete by:  As directed    Notify physician for uterine contractions.  These may be painless and feel like the uterus is tightening or the baby is  "balling up"    Complete by:  As directed    Notify physician for vaginal bleeding    Complete by:  As directed    PRETERM LABOR:  Includes any of the follwing symptoms that occur between 20 - [redacted] weeks gestation.  If these symptoms are not stopped, preterm labor can result in preterm delivery, placing your baby at risk    Complete by:  As directed      Allergies as of 10/23/2016   No Known Allergies     Medication List    TAKE these medications   buPROPion 300 MG 24 hr tablet Commonly known as:  WELLBUTRIN XL Take 1 tablet (300 mg total) by mouth daily.   prenatal multivitamin Tabs tablet Take 1 tablet by mouth daily at 12 noon.   Progesterone 100 MG Supp Place 1 suppository (100 mg total) vaginally at bedtime.      Follow-up Information    [facility]).   Why:  L&D follow up preterm cervical dilation Contact information: [address] [place] [phone]  Total time spent taking care of this patient: 30 minutes  Signed: Vena Austria 10/23/2016, 2:56 PM

## 2016-10-23 NOTE — OB Triage Note (Signed)
Ms. Janice Goodman here with c/o abdominal cramping that "feels like period cramps", repeat betamethasone injection today. Denies bleeding, LOF, reports positive fetal movement.

## 2016-10-23 NOTE — Discharge Instructions (Signed)
Preterm Birth °Preterm birth is a birth that happens before 37 weeks of pregnancy. Most pregnancies last about 39-41 weeks. Every week in the womb is important and is beneficial to the health of the infant. Infants born before 37 weeks of pregnancy are at a higher risk for complications. Depending on when the infant was born, he or she may be: °· Late preterm. Born between 32 weeks and 37 weeks of pregnancy. °· Very preterm. Born at less than 32 weeks of pregnancy. °· Extremely preterm. Born at less than 25 weeks of pregnancy. °The earlier a baby is born, the more likely the child will have issues related to prematurity. Complications and problems that can be seen in infants born too early include: °· Problems breathing (respiratory distress syndrome). °· Low birth weight. °· Problems feeding. °· Sleeping problems. °· Yellowing of the skin (jaundice). °· Infections such as pneumonia. °Babies born very preterm or extremely preterm are at risk for more serious medical issues. These include: °· More severe breathing issues. °· Eyesight issues. °· Brain development issues (intraventricular hemorrhage). °· Behavioral and emotional development issues. °· Growth and developmental delays. °· Cerebral palsy. °· Serious feeding or bowel complications (necrotizing enterocolitis). °What are the causes? °There are two broad categories of preterm birth. °· Spontaneous preterm birth. This is a birth resulting from preterm labor (not medically induced) or preterm premature rupture of membranes (PPROM). °· Indicated preterm birth. This is a birth resulting from labor being medically induced due to health, personal, or social reasons. °What increases the risk? °Preterm birth may be related to certain medical conditions, lifestyle factors, or demographic factors encountered by the mother or fetus. °· Medical conditions include: °¨ Multiple gestations (twins, triplets, and so on). °¨ Infection. °¨ Diabetes. °¨ Heart disease. °¨ Kidney  disease. °¨ Cervical or uterine abnormalities. °¨ Being underweight. °¨ High blood pressure or preeclampsia. °¨ Premature rupture of membranes (PROM). °¨ Birth defects in the fetus. °· Lifestyle factors include: °¨ Poor prenatal care. °¨ Poor nutrition or anemia. °¨ Cigarette smoking. °¨ Consuming alcohol. °¨ High levels of stress and lack of social or emotional support. °¨ Exposure to chemical or environmental toxins. °¨ Substance abuse. °· Demographic factors include: °¨ African-American ethnicity. °¨ Age (younger than 18 or older than 30 years of age). °¨ Low socioeconomic status. °Women with a history of preterm labor or who become pregnant within 18 months of giving birth are also at increased risk for preterm birth. °How is this diagnosed? °Your health care provider may request additional tests to diagnose underlying complications resulting from preterm birth. Tests on the infant may include: °· Physical exam. °· Blood tests. °· Chest X-rays. °· Heart-lung monitoring. °How is this treated? °After birth, special care will be taken to assess any problems or complications for the infant. Supportive care will be provided for the infant. Treatment depends on what problems are present and any complications that develop. Some preterm infants are cared for in a neonatal intensive care unit. In general, care may include: °· Maintaining temperature and oxygen in a clear heated box (baby isolette). °· Monitoring the infant's heart rate, breathing, and level of oxygen in the blood. °· Monitoring for signs of infection and, if needed, giving IV antibiotic medicine. °· Inserting a feeding tube (nose, mouth) or giving IV nutrition if unable to feed. °· Inserting a breathing tube (ventilation). °· Respiration support (continuous positive airway pressure [CPAP] or oxygen). °Treatment will change as the infant builds up strength and is able   to breathe and eat on his or her own. For some infants, no special treatment is  necessary. Parents may be educated on the potential health risks of prematurity to the infant. °Follow these instructions at home: °· Understand your infant's special conditions and needs. It may be reassuring to learn about infant CPR. °· Monitor your infant in the car seat until he or she grows and matures. Infant car seats can cause breathing difficulties for preterm infants. °· Keep your infant warm. Dress your infant in layers and keep him or her away from drafts, especially in cold months of the year. °· Wash your hands thoroughly after going to the bathroom or changing a diaper. Late preterm infants may be more prone to infection. °· Follow all your health care provider's instructions for providing support and care to your preterm infant. °· Get support from organizations and groups that understand your challenges. °· Follow up with your infant's health care provider as directed. °Prevention  °There are some things you can do to help lower your risk of having a preterm infant in the future. These include: °· Good prenatal care throughout the entire pregnancy. See a health care provider regularly for advice and tests. °· Management of underlying medical conditions. °· Proper self-care and lifestyle changes. °· Proper diet and weight control. °· Watching for signs of various infections. °Where to find more information: °March of Dimes: www.marchofdimes.com °Prematurity.org: www.prematurity.org °Contact a health care provider if: °· Your infant has feeding difficulties. °· Your infant has sleeping difficulties. °· Your infant has breathing difficulties. °· Your infant's skin starts to look yellow. °· Your infant shows signs of infection, such as a stuffy nose, fever, crying, or bluish color of the skin. °This information is not intended to replace advice given to you by your health care provider. Make sure you discuss any questions you have with your health care provider. °Document Released: 10/17/2003 Document  Revised: 01/02/2016 Document Reviewed: 02/23/2013 °Elsevier Interactive Patient Education © 2017 Elsevier Inc. ° °

## 2016-10-24 DIAGNOSIS — O0990 Supervision of high risk pregnancy, unspecified, unspecified trimester: Secondary | ICD-10-CM | POA: Insufficient documentation

## 2016-10-24 LAB — GC/CHLAMYDIA PROBE AMP
Chlamydia trachomatis, NAA: NEGATIVE
Neisseria gonorrhoeae by PCR: NEGATIVE

## 2016-10-24 MED ORDER — BUPROPION HCL ER (XL) 150 MG PO TB24: 150.0000 mg | ORAL_TABLET | Freq: Every day | ORAL | 1 refills | Status: DC

## 2016-10-29 ENCOUNTER — Ambulatory Visit (INDEPENDENT_AMBULATORY_CARE_PROVIDER_SITE_OTHER): Payer: BLUE CROSS/BLUE SHIELD | Admitting: Obstetrics and Gynecology

## 2016-10-29 ENCOUNTER — Encounter: Payer: Self-pay | Admitting: Obstetrics and Gynecology

## 2016-10-29 VITALS — BP 106/62 | Wt 215.0 lb

## 2016-10-29 DIAGNOSIS — Z113 Encounter for screening for infections with a predominantly sexual mode of transmission: Secondary | ICD-10-CM

## 2016-10-29 DIAGNOSIS — Z3A25 25 weeks gestation of pregnancy: Secondary | ICD-10-CM

## 2016-10-29 DIAGNOSIS — F331 Major depressive disorder, recurrent, moderate: Secondary | ICD-10-CM

## 2016-10-29 DIAGNOSIS — O3430 Maternal care for cervical incompetence, unspecified trimester: Secondary | ICD-10-CM

## 2016-10-29 DIAGNOSIS — Z6836 Body mass index (BMI) 36.0-36.9, adult: Secondary | ICD-10-CM | POA: Insufficient documentation

## 2016-10-29 DIAGNOSIS — Z131 Encounter for screening for diabetes mellitus: Secondary | ICD-10-CM

## 2016-10-29 DIAGNOSIS — O99212 Obesity complicating pregnancy, second trimester: Secondary | ICD-10-CM

## 2016-10-29 DIAGNOSIS — O0990 Supervision of high risk pregnancy, unspecified, unspecified trimester: Secondary | ICD-10-CM

## 2016-10-29 NOTE — Progress Notes (Signed)
Hospital follow up, pt has had some bad cramping.  Had episode of bad cramping 2 days ago and a short duration yesterday.  She has no symptoms now. No bleeding. No gush of fluid. She is using vaginal progesterone as prescribed. No cervical exam today. She was offered one.  But I would prefer to do less exams in her situation.  F/u 2 weeks for 1h gtt and rob. She was strongly encouraged to return sooner for any issues.

## 2016-11-13 ENCOUNTER — Ambulatory Visit (INDEPENDENT_AMBULATORY_CARE_PROVIDER_SITE_OTHER): Payer: BLUE CROSS/BLUE SHIELD | Admitting: Certified Nurse Midwife

## 2016-11-13 ENCOUNTER — Other Ambulatory Visit: Payer: BLUE CROSS/BLUE SHIELD

## 2016-11-13 VITALS — BP 100/60 | Wt 213.0 lb

## 2016-11-13 DIAGNOSIS — O99810 Abnormal glucose complicating pregnancy: Secondary | ICD-10-CM

## 2016-11-13 DIAGNOSIS — Z131 Encounter for screening for diabetes mellitus: Secondary | ICD-10-CM

## 2016-11-13 DIAGNOSIS — O0990 Supervision of high risk pregnancy, unspecified, unspecified trimester: Secondary | ICD-10-CM

## 2016-11-13 DIAGNOSIS — F331 Major depressive disorder, recurrent, moderate: Secondary | ICD-10-CM

## 2016-11-13 DIAGNOSIS — Z113 Encounter for screening for infections with a predominantly sexual mode of transmission: Secondary | ICD-10-CM

## 2016-11-13 DIAGNOSIS — Z3A28 28 weeks gestation of pregnancy: Secondary | ICD-10-CM

## 2016-11-13 DIAGNOSIS — O3430 Maternal care for cervical incompetence, unspecified trimester: Secondary | ICD-10-CM

## 2016-11-14 LAB — 28 WEEK RH+PANEL
BASOS: 0 %
Basophils Absolute: 0 10*3/uL (ref 0.0–0.2)
EOS (ABSOLUTE): 0.1 10*3/uL (ref 0.0–0.4)
Eos: 1 %
GESTATIONAL DIABETES SCREEN: 160 mg/dL — AB (ref 65–139)
HEMATOCRIT: 32.5 % — AB (ref 34.0–46.6)
HEMOGLOBIN: 10.8 g/dL — AB (ref 11.1–15.9)
HIV SCREEN 4TH GENERATION: NONREACTIVE
IMMATURE GRANS (ABS): 0 10*3/uL (ref 0.0–0.1)
Immature Granulocytes: 0 %
LYMPHS ABS: 2.4 10*3/uL (ref 0.7–3.1)
LYMPHS: 24 %
MCH: 26.9 pg (ref 26.6–33.0)
MCHC: 33.2 g/dL (ref 31.5–35.7)
MCV: 81 fL (ref 79–97)
MONOS ABS: 0.3 10*3/uL (ref 0.1–0.9)
Monocytes: 3 %
NEUTROS ABS: 7.2 10*3/uL — AB (ref 1.4–7.0)
Neutrophils: 72 %
Platelets: 300 10*3/uL (ref 150–379)
RBC: 4.02 x10E6/uL (ref 3.77–5.28)
RDW: 14.3 % (ref 12.3–15.4)
RPR Ser Ql: NONREACTIVE
WBC: 10.1 10*3/uL (ref 3.4–10.8)

## 2016-11-17 DIAGNOSIS — O99810 Abnormal glucose complicating pregnancy: Secondary | ICD-10-CM | POA: Insufficient documentation

## 2016-11-20 ENCOUNTER — Ambulatory Visit (INDEPENDENT_AMBULATORY_CARE_PROVIDER_SITE_OTHER): Payer: BLUE CROSS/BLUE SHIELD | Admitting: Obstetrics & Gynecology

## 2016-11-20 VITALS — BP 110/60 | Wt 216.0 lb

## 2016-11-20 DIAGNOSIS — Z6836 Body mass index (BMI) 36.0-36.9, adult: Secondary | ICD-10-CM

## 2016-11-20 DIAGNOSIS — F331 Major depressive disorder, recurrent, moderate: Secondary | ICD-10-CM

## 2016-11-20 DIAGNOSIS — Z3A29 29 weeks gestation of pregnancy: Secondary | ICD-10-CM

## 2016-11-20 DIAGNOSIS — O99212 Obesity complicating pregnancy, second trimester: Secondary | ICD-10-CM

## 2016-11-20 DIAGNOSIS — O0990 Supervision of high risk pregnancy, unspecified, unspecified trimester: Secondary | ICD-10-CM

## 2016-11-20 NOTE — Progress Notes (Signed)
No s/sx PTL.  Takes prog vag supp daily.  No bleeding or ROM PNV,FMC, Breast, pill BC, needs 3hr GTT as glucola was 161.

## 2016-11-24 ENCOUNTER — Observation Stay
Admission: EM | Admit: 2016-11-24 | Discharge: 2016-11-24 | Disposition: A | Discharge status: Home / Self Care | Payer: Medicaid Other | Attending: Obstetrics & Gynecology | Admitting: Obstetrics & Gynecology

## 2016-11-24 ENCOUNTER — Observation Stay: Payer: Medicaid Other

## 2016-11-24 ENCOUNTER — Encounter: Payer: Self-pay | Admitting: *Deleted

## 2016-11-24 DIAGNOSIS — O4693 Antepartum hemorrhage, unspecified, third trimester: Secondary | ICD-10-CM | POA: Diagnosis not present

## 2016-11-24 DIAGNOSIS — Z87891 Personal history of nicotine dependence: Secondary | ICD-10-CM | POA: Insufficient documentation

## 2016-11-24 DIAGNOSIS — F329 Major depressive disorder, single episode, unspecified: Secondary | ICD-10-CM | POA: Diagnosis not present

## 2016-11-24 DIAGNOSIS — R109 Unspecified abdominal pain: Secondary | ICD-10-CM

## 2016-11-24 DIAGNOSIS — O26893 Other specified pregnancy related conditions, third trimester: Secondary | ICD-10-CM | POA: Insufficient documentation

## 2016-11-24 DIAGNOSIS — O99343 Other mental disorders complicating pregnancy, third trimester: Secondary | ICD-10-CM | POA: Insufficient documentation

## 2016-11-24 DIAGNOSIS — Z3A29 29 weeks gestation of pregnancy: Secondary | ICD-10-CM | POA: Insufficient documentation

## 2016-11-24 LAB — URINALYSIS, COMPLETE (UACMP) WITH MICROSCOPIC
Bilirubin Urine: NEGATIVE
GLUCOSE, UA: NEGATIVE mg/dL
KETONES UR: 5 mg/dL — AB
Leukocytes, UA: NEGATIVE
Nitrite: NEGATIVE
PROTEIN: NEGATIVE mg/dL
Specific Gravity, Urine: 1.015 (ref 1.005–1.030)
pH: 6 (ref 5.0–8.0)

## 2016-11-24 MED ORDER — LACTATED RINGERS IV SOLN
INTRAVENOUS | Status: DC
  Administered 2016-11-24: 1000 mL via INTRAVENOUS

## 2016-11-24 NOTE — OB Triage Note (Signed)
Patient presents with complaint of seeing some blood while in the shower and has some on pad she wore in today. Pad has small amount of dark blood on it. Pt denies sexual intercourse over last 24 hours. Pt does use the vaginal progesterone nightly. Pt states she is having some pain in abd. And back that comes and goes. Pain has been present since yesterday.

## 2016-11-24 NOTE — Discharge Instructions (Signed)
Pelvic Rest Pelvic rest may be recommended if:  Your placenta is partially or completely covering the opening of your cervix (placenta previa).  There is bleeding between the wall of the uterus and the amniotic sac in the first trimester of pregnancy (subchorionic hemorrhage).  You went into labor too early (preterm labor). Based on your overall health and the health of your baby, your health care provider will decide if pelvic rest is right for you. How do I rest my pelvis? For as long as told by your health care provider:  Do not have sex, sexual stimulation, or an orgasm.  Do not use tampons. Do not douche. Do not put anything in your vagina.  Do not lift anything that is heavier than 10 lb (4.5 kg).  Avoid activities that take a lot of effort (are strenuous).  Avoid any activity in which your pelvic muscles could become strained. When should I seek medical care? Seek medical care if you have:  Cramping pain in your lower abdomen.  Vaginal discharge.  A low, dull backache.  Regular contractions.  Uterine tightening. When should I seek immediate medical care? Seek immediate medical care if:  You have vaginal bleeding and you are pregnant. This information is not intended to replace advice given to you by your health care provider. Make sure you discuss any questions you have with your health care provider. Document Released: 11/21/2010 Document Revised: 01/02/2016 Document Reviewed: 01/28/2015 Elsevier Interactive Patient Education  2017 Elsevier Inc.  Vaginal Bleeding During Pregnancy, Third Trimester A small amount of bleeding (spotting) from the vagina is relatively common in pregnancy. Various things can cause bleeding or spotting in pregnancy. Sometimes the bleeding is normal and is not a problem. However, bleeding during the third trimester can also be a sign of something serious for the mother and the baby. Be sure to tell your health care provider about any  vaginal bleeding right away. Some possible causes of vaginal bleeding during the third trimester include:  The placenta may be partially or completely covering the opening to the cervix (placenta previa).  The placenta may have separated from the uterus (abruption of the placenta).  There may be an infection or growth on the cervix.  You may be starting labor, called discharging of the mucus plug.  The placenta may grow into the muscle layer of the uterus (placenta accreta). Follow these instructions at home: Watch your condition for any changes. The following actions may help to lessen any discomfort you are feeling:  Follow your health care provider's instructions for limiting your activity. If your health care provider orders bed rest, you may need to stay in bed and only get up to use the bathroom. However, your health care provider may allow you to continue light activity.  If needed, make plans for someone to help with your regular activities and responsibilities while you are on bed rest.  Keep track of the number of pads you use each day, how often you change pads, and how soaked (saturated) they are. Write this down.  Do not use tampons. Do not douche.  Do not have sexual intercourse or orgasms until approved by your health care provider.  Follow your health care provider's advice about lifting, driving, and physical activities.  If you pass any tissue from your vagina, save the tissue so you can show it to your health care provider.  Only take over-the-counter or prescription medicines as directed by your health care provider.  Do not take aspirin  Do not take aspirin because it can make you bleed. °· Keep all follow-up appointments as directed by your health care provider. °Contact a health care provider if: °· You have any vaginal bleeding during any part of your pregnancy. °· You have cramps or labor pains. °· You have a fever, not controlled by medicine. °Get help right away if: °· You  have severe cramps or pain in your back or belly (abdomen). °· You have chills. °· You have a gush of fluid from the vagina. °· You pass large clots or tissue from your vagina. °· Your bleeding increases. °· You feel light-headed or weak. °· You pass out. °· You feel less movement or no movement of the baby. °This information is not intended to replace advice given to you by your health care provider. Make sure you discuss any questions you have with your health care provider. °Document Released: 10/17/2002 Document Revised: 01/02/2016 Document Reviewed: 04/03/2013 °Elsevier Interactive Patient Education © 2017 Elsevier Inc. ° °

## 2016-11-24 NOTE — Discharge Summary (Signed)
Physician Final Progress Note  Patient ID: Janice Goodman MRN: [id number] DOB/AGE: 10-Jul-1986 31 y.o.  Admit date: 11/24/2016 Admitting provider: Tresea Mall, CNM Discharge date: 11/24/2016   Admission Diagnoses: vaginal bleeding and abdominal cramping  Discharge Diagnoses:  Active Problems:   Indication for care in labor and delivery, antepartum IUP at [redacted]w[redacted]d with reactive nst, not in labor, no evidence of placental abruption on U/S, dehydrated  History of Present Illness: The patient is a 31 y.o. female G1P0 at [redacted]w[redacted]d who presents for abdominal pain and cramping since yesterday and spotting since this morning. The patient admits positive fetal movement. She admits drinking increased H2O, She denies leakage of fluid. She denies recent intercourse. Pt was admitted for observation, placed on monitors, IV fluids, labs, abdominal U/S.   Past Medical History:  Diagnosis Date  . Chlamydia   . History of chicken pox   . History of depression   . History of headache   . History of UTI     Past Surgical History:  Procedure Laterality Date  . broken finger  2012    No current facility-administered medications on file prior to encounter.    Current Outpatient Prescriptions on File Prior to Encounter  Medication Sig Dispense Refill  . buPROPion (WELLBUTRIN XL) 150 MG 24 hr tablet Take 1 tablet (150 mg total) by mouth daily. 30 tablet 1  . Prenatal Vit-Fe Fumarate-FA (PRENATAL MULTIVITAMIN) TABS tablet Take 1 tablet by mouth daily at 12 noon.    . Progesterone 100 MG SUPP Place 1 suppository (100 mg total) vaginally at bedtime. 30 suppository 6    No Known Allergies  Social History   Social History  . Marital status: Single    Spouse name: N/A  . Number of children: N/A  . Years of education: N/A   Occupational History  . Not on file.   Social History Main Topics  . Smoking status: Former Games developer  . Smokeless tobacco: Never Used  . Alcohol use No     Comment: occ  . Drug  use: No  . Sexual activity: Yes    Birth control/ protection: None   Other Topics Concern  . Not on file   Social History Narrative  . No narrative on file    Physical Exam: Temp 98.2 F (36.8 C) (Oral)   Resp 18   Ht  (1.626 m)   Wt 216 lb (98 kg)   LMP 04/25/2016   BMI 37.08 kg/m   Gen: NAD CV: RRR Pulm: CTAB Pelvic: Cervix appears closed on u/s Toco: irritability, palpates mild, frequency every 8-20 min, duration less than 30 seconds Fetal Well Being: 145 bpm, moderate variability, +accels, -decels Ext: no evidence of DVT, no swelling  Consults: None  Significant Findings/ Diagnostic Studies: labs and U/S  Results for Janice Goodman (MRN [id number]) as of 11/24/2016 12:27  Ref. Range 11/24/2016 09:21 11/24/2016 11:26  Appearance Latest Ref Range: CLEAR  CLEAR (A)   Bacteria, UA Latest Ref Range: NONE SEEN  RARE (A)   Bilirubin Urine Latest Ref Range: NEGATIVE  NEGATIVE   Color, Urine Latest Ref Range: YELLOW  YELLOW (A)   Glucose Latest Ref Range: NEGATIVE mg/dL NEGATIVE   Hgb urine dipstick Latest Ref Range: NEGATIVE  LARGE (A)   Ketones, ur Latest Ref Range: NEGATIVE mg/dL 5 (A)   Leukocytes, UA Latest Ref Range: NEGATIVE  NEGATIVE   Mucous Unknown PRESENT   Nitrite Latest Ref Range: NEGATIVE  NEGATIVE   pH Latest Ref Range: 5.0 -  8.0  6.0   Protein Latest Ref Range: NEGATIVE mg/dL NEGATIVE   RBC / HPF Latest Ref Range: 0 - 5 RBC/hpf TOO NUMEROUS TO C...   Specific Gravity, Urine Latest Ref Range: 1.005 - 1.030  1.015   Squamous Epithelial / LPF Latest Ref Range: NONE SEEN  0-5 (A)   WBC, UA Latest Ref Range: 0 - 5 WBC/hpf 0-5   URINE CULTURE Unknown Rpt   US OB LIMITED Unknown  Rpt    Procedures: NST  Discharge Condition: good  Disposition: 01-Home or Self Care  Diet: Regular diet  Discharge Activity: Activity as tolerated  Discharge Instructions    Discharge activity:  No Restrictions    Complete by:  As directed    Discharge diet:  No  restrictions    Complete by:  As directed    Notify physician for a general feeling that "something is not right"    Complete by:  As directed    Notify physician for increase or change in vaginal discharge    Complete by:  As directed    Notify physician for intestinal cramps, with or without diarrhea, sometimes described as "gas pain"    Complete by:  As directed    Notify physician for leaking of fluid    Complete by:  As directed    Notify physician for low, dull backache, unrelieved by heat or Tylenol    Complete by:  As directed    Notify physician for menstrual like cramps    Complete by:  As directed    Notify physician for pelvic pressure    Complete by:  As directed    Notify physician for uterine contractions.  These may be painless and feel like the uterus is tightening or the baby is  "balling up"    Complete by:  As directed    Notify physician for vaginal bleeding    Complete by:  As directed    PRETERM LABOR:  Includes any of the follwing symptoms that occur between 20 - [redacted] weeks gestation.  If these symptoms are not stopped, preterm labor can result in preterm delivery, placing your baby at risk    Complete by:  As directed    Sexual Activity:      Complete by:  As directed    Restricted for cramping/bleeding     Allergies as of 11/24/2016   No Known Allergies     Medication List    TAKE these medications   buPROPion 150 MG 24 hr tablet Commonly known as:  WELLBUTRIN XL Take 1 tablet (150 mg total) by mouth daily.   prenatal multivitamin Tabs tablet Take 1 tablet by mouth daily at 12 noon.   Progesterone 100 MG Supp Place 1 suppository (100 mg total) vaginally at bedtime.      Follow-up Information    [facility] Follow up.   Contact information: [address] [place] [address] [phone]          Total time spent taking care of this patient: 30 minutes  Signed: Tresea Mall, CNM  11/24/2016, 12:21  PM

## 2016-11-25 LAB — URINE CULTURE

## 2016-11-27 ENCOUNTER — Other Ambulatory Visit: Payer: BLUE CROSS/BLUE SHIELD

## 2016-11-27 DIAGNOSIS — O0990 Supervision of high risk pregnancy, unspecified, unspecified trimester: Secondary | ICD-10-CM

## 2016-11-27 DIAGNOSIS — O99810 Abnormal glucose complicating pregnancy: Secondary | ICD-10-CM

## 2016-11-28 LAB — GESTATIONAL GLUCOSE TOLERANCE
Glucose, Fasting: 78 mg/dL (ref 65–94)
Glucose, GTT - 1 Hour: 160 mg/dL (ref 65–179)
Glucose, GTT - 2 Hour: 148 mg/dL (ref 65–154)
Glucose, GTT - 3 Hour: 94 mg/dL (ref 65–139)

## 2016-11-29 ENCOUNTER — Inpatient Hospital Stay
Admission: EM | Admit: 2016-11-29 | Discharge: 2016-12-01 | DRG: 774 | Disposition: A | Discharge status: Home / Self Care | Payer: BLUE CROSS/BLUE SHIELD | Attending: Obstetrics and Gynecology | Admitting: Obstetrics and Gynecology

## 2016-11-29 ENCOUNTER — Encounter: Payer: Self-pay | Admitting: *Deleted

## 2016-11-29 DIAGNOSIS — O99824 Streptococcus B carrier state complicating childbirth: Secondary | ICD-10-CM | POA: Diagnosis present

## 2016-11-29 DIAGNOSIS — F32A Depression, unspecified: Secondary | ICD-10-CM | POA: Diagnosis present

## 2016-11-29 DIAGNOSIS — O0990 Supervision of high risk pregnancy, unspecified, unspecified trimester: Secondary | ICD-10-CM

## 2016-11-29 DIAGNOSIS — F329 Major depressive disorder, single episode, unspecified: Secondary | ICD-10-CM | POA: Diagnosis present

## 2016-11-29 DIAGNOSIS — Z6836 Body mass index (BMI) 36.0-36.9, adult: Secondary | ICD-10-CM | POA: Diagnosis not present

## 2016-11-29 DIAGNOSIS — Z87891 Personal history of nicotine dependence: Secondary | ICD-10-CM

## 2016-11-29 DIAGNOSIS — O3430 Maternal care for cervical incompetence, unspecified trimester: Secondary | ICD-10-CM | POA: Diagnosis present

## 2016-11-29 DIAGNOSIS — E669 Obesity, unspecified: Secondary | ICD-10-CM | POA: Diagnosis present

## 2016-11-29 DIAGNOSIS — O4593 Premature separation of placenta, unspecified, third trimester: Secondary | ICD-10-CM | POA: Diagnosis present

## 2016-11-29 DIAGNOSIS — O99214 Obesity complicating childbirth: Secondary | ICD-10-CM | POA: Diagnosis present

## 2016-11-29 DIAGNOSIS — Z3A30 30 weeks gestation of pregnancy: Secondary | ICD-10-CM

## 2016-11-29 DIAGNOSIS — O99212 Obesity complicating pregnancy, second trimester: Secondary | ICD-10-CM | POA: Diagnosis present

## 2016-11-29 LAB — WET PREP, GENITAL
SPERM: NONE SEEN
Trich, Wet Prep: NONE SEEN
WBC WET PREP: NONE SEEN
Yeast Wet Prep HPF POC: NONE SEEN

## 2016-11-29 LAB — CBC
HEMATOCRIT: 33.4 % — AB (ref 35.0–47.0)
HEMOGLOBIN: 10.6 g/dL — AB (ref 12.0–16.0)
MCH: 26.6 pg (ref 26.0–34.0)
MCHC: 31.9 g/dL — AB (ref 32.0–36.0)
MCV: 83.4 fL (ref 80.0–100.0)
Platelets: 318 10*3/uL (ref 150–440)
RBC: 4.01 MIL/uL (ref 3.80–5.20)
RDW: 14.7 % — ABNORMAL HIGH (ref 11.5–14.5)
WBC: 15.9 10*3/uL — ABNORMAL HIGH (ref 3.6–11.0)

## 2016-11-29 LAB — URINALYSIS, ROUTINE W REFLEX MICROSCOPIC
Bacteria, UA: NONE SEEN
Bilirubin Urine: NEGATIVE
GLUCOSE, UA: NEGATIVE mg/dL
KETONES UR: 80 mg/dL — AB
NITRITE: NEGATIVE
Protein, ur: 100 mg/dL — AB
Specific Gravity, Urine: 1.024 (ref 1.005–1.030)
pH: 5 (ref 5.0–8.0)

## 2016-11-29 LAB — URINE DRUG SCREEN, QUALITATIVE (ARMC ONLY)
Amphetamines, Ur Screen: NOT DETECTED
BARBITURATES, UR SCREEN: NOT DETECTED
Benzodiazepine, Ur Scrn: NOT DETECTED
CANNABINOID 50 NG, UR ~~LOC~~: NOT DETECTED
COCAINE METABOLITE, UR ~~LOC~~: NOT DETECTED
MDMA (Ecstasy)Ur Screen: NOT DETECTED
Methadone Scn, Ur: NOT DETECTED
OPIATE, UR SCREEN: NOT DETECTED
Phencyclidine (PCP) Ur S: NOT DETECTED
Tricyclic, Ur Screen: NOT DETECTED

## 2016-11-29 LAB — CHLAMYDIA/NGC RT PCR (ARMC ONLY)
CHLAMYDIA TR: NOT DETECTED
N gonorrhoeae: NOT DETECTED

## 2016-11-29 LAB — TYPE AND SCREEN
ABO/RH(D): B POS
Antibody Screen: NEGATIVE

## 2016-11-29 MED ORDER — ACETAMINOPHEN 325 MG PO TABS: 650.0000 mg | ORAL_TABLET | ORAL | Status: DC | PRN

## 2016-11-29 MED ORDER — OXYCODONE HCL 5 MG PO TABS
5.0000 mg | ORAL_TABLET | ORAL | Status: DC | PRN
  Administered 2016-11-29: 5 mg via ORAL
  Filled 2016-11-29: qty 1

## 2016-11-29 MED ORDER — BENZOCAINE-MENTHOL 20-0.5 % EX AERO: 1.0000 "application " | INHALATION_SPRAY | CUTANEOUS | Status: DC | PRN

## 2016-11-29 MED ORDER — LACTATED RINGERS IV SOLN: 500.0000 mL | INTRAVENOUS | Status: DC | PRN

## 2016-11-29 MED ORDER — ONDANSETRON HCL 4 MG/2ML IJ SOLN: 4.0000 mg | Freq: Four times a day (QID) | INTRAMUSCULAR | Status: DC | PRN

## 2016-11-29 MED ORDER — IBUPROFEN 600 MG PO TABS
600.0000 mg | ORAL_TABLET | Freq: Four times a day (QID) | ORAL | Status: DC
  Administered 2016-11-29 – 2016-12-01 (×8): 600 mg via ORAL
  Filled 2016-11-29 (×10): qty 1

## 2016-11-29 MED ORDER — METHYLERGONOVINE MALEATE 0.2 MG/ML IJ SOLN
INTRAMUSCULAR | Status: AC
  Administered 2016-11-29: 0.2 mg via INTRAMUSCULAR
  Filled 2016-11-29: qty 1

## 2016-11-29 MED ORDER — SODIUM CHLORIDE 0.9 % IV SOLN
2.0000 g | Freq: Once | INTRAVENOUS | Status: AC
Start: 2016-11-29 — End: 2016-11-29
  Administered 2016-11-29: 2 g via INTRAVENOUS
  Filled 2016-11-29: qty 2000

## 2016-11-29 MED ORDER — TETANUS-DIPHTH-ACELL PERTUSSIS 5-2.5-18.5 LF-MCG/0.5 IM SUSP
0.5000 mL | Freq: Once | INTRAMUSCULAR | Status: DC
  Filled 2016-11-29: qty 0.5

## 2016-11-29 MED ORDER — HYDROMORPHONE HCL 1 MG/ML IJ SOLN
1.0000 mg | INTRAMUSCULAR | Status: AC
  Administered 2016-11-29: 1 mg via INTRAVENOUS
  Filled 2016-11-29: qty 1

## 2016-11-29 MED ORDER — SODIUM CHLORIDE 0.9 % IV SOLN: 1.0000 g | INTRAVENOUS | Status: DC

## 2016-11-29 MED ORDER — LACTATED RINGERS IV SOLN
INTRAVENOUS | Status: DC
  Administered 2016-11-29 (×2): via INTRAVENOUS

## 2016-11-29 MED ORDER — ONDANSETRON HCL 4 MG/2ML IJ SOLN: 4.0000 mg | INTRAMUSCULAR | Status: DC | PRN

## 2016-11-29 MED ORDER — WITCH HAZEL-GLYCERIN EX PADS: 1.0000 "application " | MEDICATED_PAD | CUTANEOUS | Status: DC | PRN

## 2016-11-29 MED ORDER — OXYTOCIN BOLUS FROM INFUSION
500.0000 mL | Freq: Once | INTRAVENOUS | Status: AC
  Administered 2016-11-29: 500 mL via INTRAVENOUS

## 2016-11-29 MED ORDER — SIMETHICONE 80 MG PO CHEW: 80.0000 mg | CHEWABLE_TABLET | ORAL | Status: DC | PRN

## 2016-11-29 MED ORDER — LACTATED RINGERS IV SOLN
INTRAVENOUS | Status: DC
  Administered 2016-11-30: via INTRAVENOUS

## 2016-11-29 MED ORDER — LIDOCAINE HCL (PF) 1 % IJ SOLN
INTRAMUSCULAR | Status: AC
  Filled 2016-11-29: qty 30

## 2016-11-29 MED ORDER — BUTORPHANOL TARTRATE 1 MG/ML IJ SOLN
INTRAMUSCULAR | Status: AC
  Administered 2016-11-29: 1 mg via INTRAVENOUS
  Filled 2016-11-29: qty 1

## 2016-11-29 MED ORDER — OXYTOCIN 40 UNITS IN LACTATED RINGERS INFUSION - SIMPLE MED
2.5000 [IU]/h | INTRAVENOUS | Status: DC
  Administered 2016-11-29: 2.5 [IU]/h via INTRAVENOUS

## 2016-11-29 MED ORDER — MAGNESIUM SULFATE BOLUS VIA INFUSION
6.0000 g | Freq: Once | INTRAVENOUS | Status: AC
Start: 2016-11-29 — End: 2016-11-29
  Administered 2016-11-29: 6 g via INTRAVENOUS
  Filled 2016-11-29: qty 500

## 2016-11-29 MED ORDER — OXYTOCIN 10 UNIT/ML IJ SOLN
INTRAMUSCULAR | Status: AC
  Filled 2016-11-29: qty 2

## 2016-11-29 MED ORDER — DIPHENHYDRAMINE HCL 25 MG PO CAPS: 25.0000 mg | ORAL_CAPSULE | Freq: Four times a day (QID) | ORAL | Status: DC | PRN

## 2016-11-29 MED ORDER — AMMONIA AROMATIC IN INHA
RESPIRATORY_TRACT | Status: AC
  Filled 2016-11-29: qty 10

## 2016-11-29 MED ORDER — COCONUT OIL OIL
1.0000 "application " | TOPICAL_OIL | Status: DC | PRN
  Administered 2016-11-29: 1 via TOPICAL
  Filled 2016-11-29: qty 120

## 2016-11-29 MED ORDER — ONDANSETRON HCL 4 MG PO TABS: 4.0000 mg | ORAL_TABLET | ORAL | Status: DC | PRN

## 2016-11-29 MED ORDER — OXYTOCIN 40 UNITS IN LACTATED RINGERS INFUSION - SIMPLE MED
INTRAVENOUS | Status: AC
  Administered 2016-11-29: 500 mL via INTRAVENOUS
  Filled 2016-11-29: qty 1000

## 2016-11-29 MED ORDER — FENTANYL CITRATE (PF) 100 MCG/2ML IJ SOLN
50.0000 ug | INTRAMUSCULAR | Status: AC
  Administered 2016-11-29: 50 ug via INTRAVENOUS
  Filled 2016-11-29: qty 2

## 2016-11-29 MED ORDER — DIBUCAINE 1 % RE OINT: 1.0000 "application " | TOPICAL_OINTMENT | RECTAL | Status: DC | PRN

## 2016-11-29 MED ORDER — SOD CITRATE-CITRIC ACID 500-334 MG/5ML PO SOLN: 30.0000 mL | ORAL | Status: DC | PRN

## 2016-11-29 MED ORDER — OXYCODONE HCL 5 MG PO TABS
10.0000 mg | ORAL_TABLET | ORAL | Status: DC | PRN
  Administered 2016-11-30 – 2016-12-01 (×7): 10 mg via ORAL
  Filled 2016-11-29 (×7): qty 2

## 2016-11-29 MED ORDER — TERBUTALINE SULFATE 1 MG/ML IJ SOLN
INTRAMUSCULAR | Status: AC
  Administered 2016-11-29: 1 mg via SUBCUTANEOUS
  Filled 2016-11-29: qty 1

## 2016-11-29 MED ORDER — SENNOSIDES-DOCUSATE SODIUM 8.6-50 MG PO TABS
2.0000 | ORAL_TABLET | ORAL | Status: DC
  Administered 2016-11-30 (×2): 2 via ORAL
  Filled 2016-11-29 (×2): qty 2

## 2016-11-29 MED ORDER — BETAMETHASONE SOD PHOS & ACET 6 (3-3) MG/ML IJ SUSP
12.0000 mg | INTRAMUSCULAR | Status: DC
  Administered 2016-11-29: 12 mg via INTRAMUSCULAR
  Filled 2016-11-29: qty 1

## 2016-11-29 MED ORDER — MISOPROSTOL 200 MCG PO TABS
ORAL_TABLET | ORAL | Status: AC
  Administered 2016-11-29: 800 ug
  Filled 2016-11-29: qty 4

## 2016-11-29 MED ORDER — LACTATED RINGERS IV SOLN: INTRAVENOUS | Status: DC

## 2016-11-29 MED ORDER — PRENATAL MULTIVITAMIN CH
1.0000 | ORAL_TABLET | Freq: Every day | ORAL | Status: DC
  Administered 2016-11-30 – 2016-12-01 (×2): 1 via ORAL
  Filled 2016-11-29 (×3): qty 1

## 2016-11-29 MED ORDER — TERBUTALINE SULFATE 1 MG/ML IJ SOLN
0.2500 mg | Freq: Once | INTRAMUSCULAR | Status: AC
  Administered 2016-11-29: 1 mg via SUBCUTANEOUS

## 2016-11-29 MED ORDER — MAGNESIUM SULFATE 50 % IJ SOLN
2.0000 g/h | INTRAVENOUS | Status: DC
  Filled 2016-11-29 (×2): qty 80

## 2016-11-29 NOTE — Progress Notes (Addendum)
Patient ID: Janice Goodman, female   DOB: October 24, 1985, 31 y.o.   MRN: [id number]   Patient with improved pain: if the pain level was 10 before, it is a 5 now.   No signs/symptoms of mag toxicity. Continues to have mild vaginal bleeding without obvious passage of clots. Fetal tracing category 1  Continue magnesium sulfate therapy. Continue to monitor for signs of abruption Continue ampicillin for GBS prophylaxis until stable. Pain meds, prn as long as no fetal concerns. Neonatology aware and has seen patient.  [name], MD 11/29/2016 3:50 PM    ADDENDUM: Reason for ampicillin updated as in previous notes.

## 2016-11-29 NOTE — Lactation Note (Signed)
This note was copied from a baby's chart. Mom has 30.2 week preemie in SCN.  Assisted mom with hand expressing and pumping with Symphony pump.  Instructed mom in pumping, handling, collection and storage and how to clean pump parts.  Mom has large, dry nipples.  #30 flanges given.  Coconut oil given for dry nipples.

## 2016-11-29 NOTE — Discharge Summary (Signed)
OB Discharge Summary     Patient Name: Janice Goodman DOB: Nov 09, 1985 MRN: [id number]  Date of admission: 11/29/2016 Delivering provider: Tresea Mall, CNM  Date of Delivery: 11/29/16 Date of discharge: 12/01/2016   Admitting diagnosis: 30 wks preg Intrauterine pregnancy: [redacted]w[redacted]d     Secondary diagnosis: obesity and preterm labor     Discharge diagnosis: Preterm Pregnancy Delivered                                                                                                Post partum procedures:none  Augmentation: none  Complications: possible placental abruption, postpartum hemorrhage 800 ml, cytotec and IV pitocin given  Hospital course:  Onset of Labor With Vaginal Delivery      31 y.o. yo G1P0 at [redacted]w[redacted]d was admitted with preterm contractions on 11/29/2016.  Patient received steroids for fetal lung maturity, magnesium for tocolysis and neuro development Membrane Rupture Time/Date:  4:56 PM 11/29/2016  Patient had a delivery of a Viable female infant 4:56 PM 11/29/2016. Details of delivery can be found in a separate delivery note   Pateint had an uncomplicated postpartum course.   She is ambulating, tolerating a regular diet, passing flatus, and urinating well.  Patient is discharged home in stable condition on 12/01/2016.   Physical exam  Vitals:   11/30/16 2101 11/30/16 2344 12/01/16 0258 12/01/16 0734  BP: 106/63 115/60 (!) 101/43 126/68  Pulse: (!) 105 89 81 93  Resp: Temp: 97.8 F (36.6 C) 98.7 F (37.1 C) 98 F (36.7 C) 98.1 F (36.7 C)  TempSrc: Oral Oral Oral Oral  SpO2: 99% 99%  100%  Weight:      Height:       General: alert, cooperative and no distress Lochia: appropriate Uterine Fundus: firm Incision: N/A DVT Evaluation: No evidence of DVT seen on physical exam.  Labs: Lab Results  Component Value Date   WBC 19.1 (H) 11/30/2016   HGB 8.9 (L) 11/30/2016   HCT 26.5 (L) 11/30/2016   MCV 82.3 11/30/2016   PLT 296 11/30/2016     Discharge instruction: per After Visit Summary.  Medications:  Allergies as of 12/01/2016   No Known Allergies     Medication List    STOP taking these medications   Progesterone 100 MG Supp     TAKE these medications   buPROPion 150 MG 24 hr tablet Commonly known as:  WELLBUTRIN XL Take 1 tablet (150 mg total) by mouth daily.   Ferrous Fumarate 324 (106 Fe) MG Tabs tablet Commonly known as:  HEMOCYTE - 106 mg FE Take 1 tablet (106 mg of iron total) by mouth 2 (two) times daily.   ibuprofen 600 MG tablet Commonly known as:  ADVIL,MOTRIN Take 1 tablet (600 mg total) by mouth every 6 (six) hours.   prenatal multivitamin Tabs tablet Take 1 tablet by mouth daily at 12 noon.      Diet: routine diet  Activity: Advance as tolerated. Pelvic rest for 6 weeks.   Outpatient follow up: Follow-up Information    [facility]. Schedule an appointment as soon  as possible for a visit in 10 day(s).   Why:  postpartum follow up and work clearance, pp depression Financial controller information: [address] [place] [address] [phone]            Postpartum contraception: TBD, considering Nexplanon Rhogam Given postpartum: NA Rubella vaccine given postpartum: Rubella Immune Varicella vaccine given postpartum: Varicella Immune TDaP given antepartum or postpartum: PP  Newborn Data: Live born female  Birth Weight: 3 lb 1 oz (1390 g) APGAR: 9 at 1 minute per CNM Jones who was in the room just after delivery   Baby Feeding: breast  Disposition:NICU  SIGNED:  [name], MD 12/01/2016 11:00 AM

## 2016-11-29 NOTE — Consult Note (Signed)
Neonatology Prenatal Consultation: Requested by: Janice Goodman  MRN: [id number] CSN: [id number]  I have reviewed the records and discussed the patient with Dr. Jean Goodman.  Janice Goodman presents with evidence of pre-term labor at [redacted] weeks EGA with contractions, cervical changes, and probable bleeding from the cervical os.  I explained to Janice Goodman the usual problems for babies born at [redacted] weeks EGA, including respiratory immaturity, brain hemorrhage, infection and increased risk of mortality.  I explained that the prenatal steroids would reduce the risk for those but that it would not eliminate the risk.  She did not have any questions at this time, and I re-assured her that I would be immediately available if she should have any.  Janice Goodman L. Minus Breeding.D.

## 2016-11-29 NOTE — H&P (Addendum)
OB History & Physical   History of Present Illness:  Chief Complaint: contractions  HPI:  Janice Goodman is a 31 y.o. G1P0 female at [redacted]w[redacted]d dated by a 9 week ultrasound.  Her pregnancy has been complicated by obesity (BMI 36), preterm labor at 25 weeks (status post BMZ at 25 weeks and has been using a vaginal progesterone since that time),.  During her workup 5 weeks ago she had a normal wet prep, GC/CT negative, and a negative urine culture.  She was seen last week on 4/17, and a urine culture showed NO GBS bacteruria (<10,000 cfu/ml).   She reports contractions, very painful, since yesterday late afternoon.   She denies leakage of fluid.   She denies vaginal bleeding.   She reports fetal movement.    Maternal Medical History:   Past Medical History:  Diagnosis Date  . Chlamydia   . History of chicken pox   . History of depression   . History of headache   . History of UTI     Past Surgical History:  Procedure Laterality Date  . broken finger  2012   Allergies: No Known Allergies  Prior to Admission medications   Medication Sig Start Date End Date Taking? Authorizing Provider  buPROPion (WELLBUTRIN XL) 150 MG 24 hr tablet Take 1 tablet (150 mg total) by mouth daily. 10/22/16   Farrel Conners, CNM  Prenatal Vit-Fe Fumarate-FA (PRENATAL MULTIVITAMIN) TABS tablet Take 1 tablet by mouth daily at 12 noon.    Historical Provider, MD  Progesterone 100 MG SUPP Place 1 suppository (100 mg total) vaginally at bedtime. 10/23/16   Vena Austria, MD    OB History  Gravida Para Term Preterm AB Living  1            SAB TAB Ectopic Multiple Live Births               # Outcome Date GA Lbr Len/2nd Weight Sex Delivery Anes PTL Lv  1 Current               Prenatal care site: [facility]  Social History: She  reports that she has quit smoking. She has never used smokeless tobacco. She reports that she does not drink alcohol or use drugs.  Family History: family history includes  Cancer in her paternal grandmother and paternal uncle; Ovarian cysts in her mother.   Review of Systems  Constitutional: Negative.   HENT: Negative.   Eyes: Negative.   Respiratory: Negative.   Cardiovascular: Negative.   Gastrointestinal: Positive for nausea and vomiting. Negative for constipation and diarrhea.       Per HPI, denies urinary symptoms, vaginal symptoms.  Genitourinary:       Per HPI  Musculoskeletal: Negative.   Skin: Negative.   Neurological: Negative.   Psychiatric/Behavioral: Negative.      Physical Exam:  Vital Signs: BP 124/67 (BP Location: Left Arm)   Pulse 91   Temp 97.6 F (36.4 C)   Resp 18   LMP 04/25/2016  Constitutional: Well nourished, well developed female in mild-moderate distress with contractions.  HEENT: normal Skin: Warm and dry.  Cardiovascular: Regular rate and rhythm.   Extremity: no edema  Respiratory: Clear to auscultation bilateral. Normal respiratory effort Abdomen: gravid, mildly ttp with ctx, +BS, no rebound or guarding Back: no CVAT Neuro: DTRs 2+, Cranial nerves grossly intact Psych: Alert and Oriented x3. No memory deficits. Normal mood and affect.  MS: normal gait, normal bilateral lower extremity ROM/strength/stability.  Pelvic  exam: (female chaperone present) EGBUS: within normal limits Vagina: within normal limits and with normal mucosa blood in the vault Cervix: 3/90/-3, membranes intact.   Swabs taken for Wet prep and gonorrhea/chlamydia.    Pertinent Results:  Prenatal Labs: Blood type/Rh B positive  Antibody screen negative  Rubella Immune  Varicella Immune    RPR NR  HBsAg negative  HIV negative  GC negative  Chlamydia Negative (though positive on 11/27 with TOC in January)  Genetic screening Not done   1 hour GTT Early (93), 28 week 160   3 hour GTT normal 78, 160, 148, 94  GBS positive bacteruria on 11/24/16   Baseline FHR: 140 beats/min   Variability: moderate   Accelerations: present    Decelerations: absent Contractions: present frequency: not tracing well, but patient reporting q 5 min Overall assessment: category 1  Bedside Ultrasound:  Number of Fetus: 1  Presentation: cephalic  Fluid: subjectively normal  Placental Location: anterior  Assessment:  Janice Goodman is a 31 y.o. G1P0 female at [redacted]w[redacted]d with preterm labor.   Plan:  1. Admit to Labor & Delivery  2. CBC, T&S, Clrs, IVF 3. GBS positive: ampicillin per protocol 4. Fetwal well-being: reassuring 5. Preterm labor: based on symptoms and recent admission for symptoms last week with no noted cervical change at that time, it appears she is in preterm labor.  Discussed the implications of this.  Plan discussed: 1. Magnesium for neuroprophylaxis, and tocolysis given Gestational age 68. Ampicillin for GBS unknown preterm. 3. Repeat course of BMTZ since she had her prior course 5 weeks ago (>2 weeks) and has high risk of delivering within the next 7 days. 4. NICU aware 5. Will send wet prep, gonorrhea/chlamydai, repeat urine culture.   6. Discussed risks and benefits of all the above treatment.     [name], MD 11/29/2016 12:53 PM    ADDENDUM: I corrected this note to reflect that I misread the urine culture from 4/17.  The culture states specifically that no GBS was found. I read this incorrectly, as a specific organism is not usually mentioned as being absent on culture.  Even still, it was correct to give antibiotics for GBS for prophylaxis, given her preterm status with unknown GBS.

## 2016-11-29 NOTE — Progress Notes (Addendum)
Patient ID: Janice Goodman, female   DOB: [birth year], 31 y.o.   MRN: [id number]  Labor Check  Subj:  Complaints: "gush of fluid." noted to be blood on the perineum just now.    Obj:  BP 124/67 (BP Location: Left Arm)   Pulse 91   Temp 97.6 F (36.4 C)   Resp 18   LMP 04/25/2016     Cervix: Dilation: 3 / Effacement (%): 100 / Station: -3    SSE: blood on perineum and a moderate amount of red blood mixed with some clots.  All removed with large q-tip. Unable to adequately visualize cervix to see if source is cervix (recent SSE). Some well-organized clots noted, approximately golf-ball sized.  Some bright-red blood.  Once removed, no quick accrual of red blood.  Membranes palpated on SVE assessing cervical dilation.  Much thinner this time with slight increase in dilation.l  Baseline FHR: 140 beats/min   Variability: moderate   Accelerations: present   Decelerations: absent Contractions: present frequency: Q 5 min Overall assessment: categor 1  A/P: 31 y.o. G1P0 female at [redacted]w[redacted]d with PTL, vaginal bleeding, source unknown.  1.  Labor: as admission plan, BMTZ, s/p dose 1 of amp, starting mag sulfate now.    2.  FWB: reassuring, Overall assessment: category 1  3.  GBS unknown and preterm - ampicillin. 4.  Pain: IV pain meds now 5.  Recheck: prn 6. Vaginal bleeding: source unclear.  Recent pelvic exam with no bleeding at that time.  So, suspect cervical source, though I could not visualize cervix well.  Also, could be abruption. Discussed these two possibilities with patient.  Continue to monitor closely. All questions answered for patient.   [name], MD 11/29/2016 2:13 PM    ADDENDUM: Note corrected to reflect reason for ampcillin administration was prophylaxis instead of treatment.

## 2016-11-29 NOTE — OB Triage Note (Signed)
Pain started yesterday @ baby shower approx 1830. Janice Goodman

## 2016-11-30 LAB — CBC
HEMATOCRIT: 26.5 % — AB (ref 35.0–47.0)
Hemoglobin: 8.9 g/dL — ABNORMAL LOW (ref 12.0–16.0)
MCH: 27.5 pg (ref 26.0–34.0)
MCHC: 33.4 g/dL (ref 32.0–36.0)
MCV: 82.3 fL (ref 80.0–100.0)
PLATELETS: 296 10*3/uL (ref 150–440)
RBC: 3.22 MIL/uL — AB (ref 3.80–5.20)
RDW: 14.4 % (ref 11.5–14.5)
WBC: 19.1 10*3/uL — AB (ref 3.6–11.0)

## 2016-11-30 LAB — URINE CULTURE: Culture: NO GROWTH

## 2016-11-30 LAB — RPR: RPR: NONREACTIVE

## 2016-11-30 MED ORDER — FERROUS FUMARATE 324 (106 FE) MG PO TABS
1.0000 | ORAL_TABLET | Freq: Every day | ORAL | Status: DC
  Administered 2016-11-30 – 2016-12-01 (×2): 106 mg via ORAL
  Filled 2016-11-30 (×2): qty 1

## 2016-11-30 MED ORDER — DOCUSATE SODIUM 100 MG PO CAPS
100.0000 mg | ORAL_CAPSULE | Freq: Every day | ORAL | Status: DC
  Administered 2016-11-30 – 2016-12-01 (×2): 100 mg via ORAL
  Filled 2016-11-30 (×2): qty 1

## 2016-11-30 MED ORDER — BUPROPION HCL ER (SR) 150 MG PO TB12
150.0000 mg | ORAL_TABLET | Freq: Every day | ORAL | Status: DC
  Administered 2016-11-30 – 2016-12-01 (×2): 150 mg via ORAL
  Filled 2016-11-30 (×2): qty 1

## 2016-11-30 NOTE — Progress Notes (Signed)
Post Partum Day 1 Subjective: up ad lib, voiding, tolerating PO and denies lightheadedness. Pumping breast milk. Baby in NICU on CPAP  Objective: Blood pressure (!) 112/57, pulse 74, temperature 98.3 F (36.8 C), temperature source Oral, resp. rate 20, last menstrual period 04/25/2016, SpO2 100 %, unknown if currently breastfeeding.  Physical Exam:  General: alert, cooperative and no distress Lochia: appropriate Uterine Fundus: firm/ U to U-1/ ML/ NT DVT Evaluation: No evidence of DVT seen on physical exam.   Recent Labs  11/29/16 1347 11/30/16 0614  HGB 10.6* 8.9*  HCT 33.4* 26.5*  WBC 15.9* 19.1*  PLT 318 296    Assessment/Plan: Stable ppd #1 Continue postpartum care Plan for discharge tomorrow Breast B POS/ RI/ VI Continue Wellbutrin Contraception: undecided TDAP upon discharge   LOS: 1 day   Farrel Conners 11/30/2016, 8:52 AM

## 2016-11-30 NOTE — Lactation Note (Signed)
This note was copied from a baby's chart. Lactation Consultation Note  Patient Name: Janice Goodman [id number] Date: 11/30/2016     Maternal Data    Feeding    LATCH Score/Interventions                      Lactation Tools Discussed/Used     Consult Status  Mom is using 108m flanges. Will be D/C on 4/24 and will rent a Symphony pump from [employer] Spoke with mom about hand free bra as well. Placed a pump kit w/ flanges at baby's bedside for mom.     [name]4/23/2018, 3:42 PM

## 2016-12-01 LAB — SURGICAL PATHOLOGY

## 2016-12-01 MED ORDER — IBUPROFEN 600 MG PO TABS: 600.0000 mg | ORAL_TABLET | Freq: Four times a day (QID) | ORAL | 0 refills | Status: AC

## 2016-12-01 MED ORDER — FERROUS FUMARATE 324 (106 FE) MG PO TABS: 1.0000 | ORAL_TABLET | Freq: Two times a day (BID) | ORAL | Status: AC

## 2016-12-01 NOTE — Lactation Note (Signed)
Lactation Consultation Note  Patient Name: Janice Goodman [id number] Date: 12/01/2016  Mom given a Symphony breastpump to use at home. She and I agreed for her to pay in full by May 8th, as she won't get paid until then. She was offered [place] [employer] to pay for it, but she declined. She had not been using the Preemie setting, so I taught her how to use it and encouraged a lot of hand expression as well. I gave her a bag of volufeeds for home use. I set up another breast pump kit with 30 mm flanges at baby's crib so Mom won't have to bring her home supplies every day. I reviewed pump storage, cleaning and transport to hospital. I welcomed her to [facility] BF support group.    Maternal Data    Feeding    [facility] Score/Interventions                      Lactation Tools Discussed/Used     Consult Status      [name] 12/01/2016, 4:26 PM

## 2016-12-01 NOTE — Progress Notes (Signed)
Pt discharged home, infant in SCN.  Discharge instructions and follow up appointment given to and reviewed with pt by Arelia Longest, RN.  Pt verbalized understanding.  Escorted by staff.

## 2016-12-04 ENCOUNTER — Encounter: Payer: BLUE CROSS/BLUE SHIELD | Admitting: Advanced Practice Midwife

## 2016-12-08 ENCOUNTER — Telehealth: Payer: Self-pay

## 2016-12-08 NOTE — Telephone Encounter (Signed)
FMLA/DISABILITY form filled out for [employer] and given to [place] for processing.

## 2016-12-15 ENCOUNTER — Ambulatory Visit: Payer: Self-pay

## 2016-12-15 ENCOUNTER — Ambulatory Visit (INDEPENDENT_AMBULATORY_CARE_PROVIDER_SITE_OTHER): Payer: BLUE CROSS/BLUE SHIELD | Admitting: Obstetrics and Gynecology

## 2016-12-15 DIAGNOSIS — F53 Postpartum depression: Secondary | ICD-10-CM

## 2016-12-15 DIAGNOSIS — O99345 Other mental disorders complicating the puerperium: Secondary | ICD-10-CM

## 2016-12-15 MED ORDER — HYDROXYZINE HCL 25 MG PO TABS: 25.0000 mg | ORAL_TABLET | Freq: Four times a day (QID) | ORAL | 2 refills | Status: DC | PRN

## 2016-12-15 MED ORDER — BUPROPION HCL ER (XL) 300 MG PO TB24: 300.0000 mg | ORAL_TABLET | Freq: Every day | ORAL | 3 refills | Status: DC

## 2016-12-15 NOTE — Lactation Note (Signed)
This note was copied from a baby's chart. Lactation Consultation Note  Patient Name: Janice Goodman OEVOJ'J Date: 12/15/2016 Reason for consult: Follow-up assessment;Infant < 6lbs (scn; 32-33 weeks gest) This was baby's first attempt to nipple at breast. Mom has large nipples which may impede progress at breast for a while, depending on how well nipple shield is able to compensate for that. Without shield, nipple was getting creased in less than a minute of nursing. With nipple shield, we let her slowly ease shield into her mouth to see how she responds. She just held it at first, and then she started taking some strong sucks off/on for a minute or so. Room too noisy to hear swallows. She spent a lot of time resting with nipple/shield in her mouth. As she was getting sleepy and coming off, she spit up fresh milk. We stopped the session, Mom help her upright to snuggle during rest of tube feeding. I encouraged Mom to pump with hand expression as well as she is only pumping 2 oz per session. I reminded ehr about trying to pump at least 8 times per 24 hours. Pump kit and DEBP at cribside for her.   Maternal Data    Feeding Feeding Type: Breast Fed Length of feed: 10 min  LATCH Score/Interventions                      Lactation Tools Discussed/Used Tools: Nipple Janice Goodman Nipple shield size: 24   Consult Status      [name] 12/15/2016, 12:35 PM

## 2016-12-16 ENCOUNTER — Telehealth: Payer: Self-pay | Admitting: Obstetrics and Gynecology

## 2016-12-16 MED ORDER — HYDROXYZINE HCL 25 MG PO TABS: 25.0000 mg | ORAL_TABLET | Freq: Four times a day (QID) | ORAL | 2 refills | Status: DC | PRN

## 2016-12-16 MED ORDER — BUPROPION HCL ER (XL) 300 MG PO TB24: 300.0000 mg | ORAL_TABLET | Freq: Every day | ORAL | 3 refills | Status: DC

## 2016-12-16 NOTE — Telephone Encounter (Signed)
Patient called and stated that her prescriptions that Dr. Bonney AidStaebler was to send in are not at her Pharm. Kim asked me to ask you to  Please send again   [facility] [place] [place]

## 2016-12-16 NOTE — Progress Notes (Signed)
Obstetrics & Gynecology Office Visit   Chief Complaint:  Chief Complaint  Patient presents with  . Postpartum Care    History of Present Illness: The patient is a 31 y.o. female presenting initial evaluation for symptoms of anxiety and depression.  The patient is currently taking Wellbutrin XR 150mg  for the management of her symptoms.  She has had any recent situational stressors, namely delivery of a preterm infant at 30 weeks, recent death of her husbands mother, her husband is currently stationed in [place] so was unable to spend her anniversary with him, she will not be able to attend her mother in law's funeral secondary to infant remaining in the NICU.  She reports symptoms of anhedonia, insomnia, irritability, social anxiety and feelings of guilt.  She denies risk taking behavior, agorophobia, feelings of worthlessness, suicidal ideation, homicidal ideation, auditory hallucinations and visual hallucinations. Symptoms have remained unchanged since initial onset.     The patient does have a pre-existing history of depression and anxiety.  She  does not a prior history of suicide attempts.  Previous treatment tied include Welbutrin.    Review of Systems: Review of Systems  Psychiatric/Behavioral: Positive for depression. Negative for hallucinations, memory loss, substance abuse and suicidal ideas. The patient is nervous/anxious and has insomnia.     Past Medical History:  Past Medical History:  Diagnosis Date  . Chlamydia   . History of chicken pox   . History of depression   . History of headache   . History of UTI     Past Surgical History:  Past Surgical History:  Procedure Laterality Date  . broken finger  2012    Gynecologic History: No LMP recorded.  Obstetric History: G1P0101  Family History:  Family History  Problem Relation Age of Onset  . Ovarian cysts Mother   . Cancer Paternal Grandmother     Lymphatic Cancer  . Cancer Paternal Uncle     Lymphatic  Cancer    Social History:  Social History   Social History  . Marital status: Single    Spouse name: N/A  . Number of children: N/A  . Years of education: N/A   Occupational History  . Not on file.   Social History Main Topics  . Smoking status: Former Games developer  . Smokeless tobacco: Never Used  . Alcohol use No     Comment: occ  . Drug use: No  . Sexual activity: Yes    Birth control/ protection: None   Other Topics Concern  . Not on file   Social History Narrative  . No narrative on file    Allergies:  No Known Allergies  Medications: Prior to Admission medications   Medication Sig Start Date End Date Taking? Authorizing Provider  buPROPion (WELLBUTRIN XL) 300 MG 24 hr tablet Take 1 tablet (300 mg total) by mouth daily. 12/15/16   Vena Austria, MD  Ferrous Fumarate (HEMOCYTE - 106 MG FE) 324 (106 Fe) MG TABS tablet Take 1 tablet (106 mg of iron total) by mouth 2 (two) times daily. 12/01/16   Conard Novak, MD  hydrOXYzine (ATARAX/VISTARIL) 25 MG tablet Take 1 tablet (25 mg total) by mouth every 6 (six) hours as needed for anxiety. 12/15/16   Vena Austria, MD  ibuprofen (ADVIL,MOTRIN) 600 MG tablet Take 1 tablet (600 mg total) by mouth every 6 (six) hours. 12/01/16   Conard Novak, MD  Prenatal Vit-Fe Fumarate-FA (PRENATAL MULTIVITAMIN) TABS tablet Take 1 tablet by mouth daily  at 12 noon.    [provider]    Physical Exam Vitals:  Vitals:   12/15/16 1500  BP: 110/76  Pulse: 85   No LMP recorded.  General: NAD HEENT: normocephalic, anicteric Pulmonary: No increased work of breathing Neurologic: Grossly intact Psychiatric: mood appropriate, affect full  Female chaperone present for pelvic and breast  portions of the physical exam  Assessment: 31 y.o. G1P0101 postpartum depression check Plan: Problem List Items Addressed This Visit    None    Visit Diagnoses    Encounter for postpartum visit    -  Primary   Postpartum depression        Relevant Medications   buPROPion (WELLBUTRIN XL) 300 MG 24 hr tablet   hydrOXYzine (ATARAX/VISTARIL) 25 MG tablet      1) Postpartum depression - increase Welbutrin XL dose to 300mg , prn vistaril.  EPDS today 17 with item 10 answered 0  2) History of preterm delivery - discussed implication for future delivery and use of [place] and cervical length ultrasound  3) Follow up in 3 weeks for 6 week postpartum visit and nexplanon insertion  4) A total of 15 minutes were spent in face-to-face contact with the patient during this encounter with over half of that time devoted to counseling and coordination of care.

## 2016-12-16 NOTE — Addendum Note (Signed)
Addended by: Lorrene ReidSTAEBLER, Gearldene Fiorenza M on: 12/16/2016 04:54 PM   Modules accepted: Orders

## 2016-12-17 NOTE — Telephone Encounter (Signed)
[facility] [place] [facility]. AMS did this yesterday after getting previous message. [facility].

## 2016-12-17 NOTE — Telephone Encounter (Signed)
Pt is calling about prescription not being a pharmacy. [facility] [address]

## 2016-12-23 ENCOUNTER — Telehealth: Payer: Self-pay

## 2016-12-23 ENCOUNTER — Other Ambulatory Visit: Payer: Self-pay | Admitting: Obstetrics and Gynecology

## 2016-12-23 MED ORDER — BUPROPION HCL ER (XL) 300 MG PO TB24: 300.0000 mg | ORAL_TABLET | Freq: Every day | ORAL | 3 refills | Status: DC

## 2016-12-23 NOTE — Telephone Encounter (Signed)
Pt calling.  When she went to p/u rxs the pharm had two rxs for hydroxyzine and not wellbutrin xl 300.  She only p/u one hydroxyzine.  [facility] the [facility] [facility] [place]  [phone]

## 2016-12-23 NOTE — Telephone Encounter (Signed)
[facility] [place] [facility] both resent on 12/16/16 I've resent the welbutrin

## 2016-12-23 NOTE — Telephone Encounter (Signed)
Please advise 

## 2016-12-24 NOTE — Telephone Encounter (Signed)
Pt aware of [facility] being [facility] [place]

## 2016-12-28 ENCOUNTER — Telehealth: Payer: Self-pay | Admitting: Obstetrics and Gynecology

## 2016-12-28 NOTE — Telephone Encounter (Signed)
Pt aware [facility] and [facility] [place] [facility] [place]. He is aware the medication has been sent in 3 times but this time I verbally called it in. [facility] and [facility]

## 2016-12-28 NOTE — Telephone Encounter (Signed)
Pt came in stating that RX (welbutrine, excuse my spelling) is not being received by the pharmacy. [facility] [address] [facility].

## 2016-12-29 ENCOUNTER — Ambulatory Visit: Payer: Self-pay

## 2016-12-29 NOTE — Lactation Note (Signed)
This note was copied from a baby's chart. Lactation Consultation Note  Patient Name: Girl Mechele Dawleylmarie Glance ZOXWR'UToday's Date: 12/29/2016  I spoke with Mom again today about milk supply as she c/o it is decreasing more now that she is back to work. We discussed some options for her to try to maximize pump sessions; hands on pumping; power pumping; stress reduction tactics, etc.    Maternal Data    Feeding Feeding Type: Bottle Fed - Breast Milk Nipple Type: Slow - flow  LATCH Score/Interventions                      Lactation Tools Discussed/Used     Consult Status      Sunday CornSandra Clark Iraida Cragin 12/29/2016, 12:53 PM

## 2017-01-02 ENCOUNTER — Ambulatory Visit: Payer: Self-pay

## 2017-01-02 NOTE — Lactation Note (Signed)
This note was copied from a baby's chart. Lactation Consultation Note  Patient Name: Janice Mechele Dawleylmarie Dugdale BMWUX'LToday's Date: 01/02/2017 Reason for consult: Follow-up assessment;NICU baby 2nd time baby has gone to breast, baby latched to breast easily, pre/post wt not performed, nursed well with stimulation on both breasts.  Baby refused bottle after breastfeeding session and spit sm amt breastmilk per her mother.      Maternal Data Does the patient have breastfeeding experience prior to this delivery?: No Mom has noticed decrease in milk production since going back to work 2 wks ago, now pumps 1-2 oz, does not pump while at work for 11 hrs on night shift. Encouraged to try to pump at least twice if possible while at work, when she is off work for 3 days pump frequently.     Feeding Feeding Type: Bottle Fed - Breast Milk Nipple Type: Slow - flow Length of feed: 20 min  LATCH Score/Interventions Latch: Grasps breast easily, tongue down, lips flanged, rhythmical sucking.  Audible Swallowing: A few with stimulation  Type of Nipple: Everted at rest and after stimulation  Comfort (Breast/Nipple): Soft / non-tender     Hold (Positioning): Assistance needed to correctly position infant at breast and maintain latch.  LATCH Score: 8  Lactation Tools Discussed/Used     Consult Status Consult Status: Follow-up Date: 01/04/17 Follow-up type: In-patient    Janice Goodman 01/02/2017, 5:21 PM

## 2017-01-05 ENCOUNTER — Telehealth: Payer: Self-pay

## 2017-01-05 DIAGNOSIS — O924 Hypogalactia: Secondary | ICD-10-CM | POA: Insufficient documentation

## 2017-01-05 MED ORDER — METOCLOPRAMIDE HCL 10 MG PO TABS: 10.0000 mg | ORAL_TABLET | Freq: Three times a day (TID) | ORAL | 1 refills | Status: DC

## 2017-01-05 NOTE — Telephone Encounter (Signed)
Pt calling - tearful/stressed about decrease in breast milk, baby in NICU,  Lactation consultant adv her to call us for rx.  Pt aware Reglan 10mg  po TID c 1rf eRx'd  Pt aware if this doesn't work then it's just not going to.  She can be proud of herself for what she has accomplished.  The first part of breastfeeding is the most important part. She has nothing to be ashamed of.

## 2017-01-06 ENCOUNTER — Ambulatory Visit: Payer: Self-pay

## 2017-01-06 NOTE — Lactation Note (Signed)
This note was copied from a baby's chart. Lactation Consultation Note  Patient Name: Janice Goodman Today's Date: 01/06/2017     Maternal Data  Mom has been experiencing low milk supply since returning to work.   Feeding Feeding Type: Other (comment) (DBM w/ formula) Length of feed: 30 min  LATCH Score/Interventions                      Lactation Tools Discussed/Used  DEBP, Mom started taking Reglan on 5/29, discussed alternatives due to hx of depression: Morninga and Brewer's Yeast. Mom has tried fenugreek and blessed thistle, no improvement.    Consult Status  Mom has been trying to pump q2hrs and is getting burnt out. LC suggested every 3-4hrs, 300-500 caloric intake, and extra sleep. Mom will f/u with LC on Fri.     Burnadette PeterJaniya M Zhion Pevehouse 01/06/2017, 4:32 PM

## 2017-01-08 ENCOUNTER — Encounter: Payer: Self-pay | Admitting: Obstetrics and Gynecology

## 2017-01-08 ENCOUNTER — Ambulatory Visit (INDEPENDENT_AMBULATORY_CARE_PROVIDER_SITE_OTHER): Payer: BLUE CROSS/BLUE SHIELD | Admitting: Obstetrics and Gynecology

## 2017-01-08 VITALS — BP 100/68 | HR 74 | Wt 203.0 lb

## 2017-01-08 DIAGNOSIS — Z30017 Encounter for initial prescription of implantable subdermal contraceptive: Secondary | ICD-10-CM | POA: Diagnosis not present

## 2017-01-08 DIAGNOSIS — O99345 Other mental disorders complicating the puerperium: Principal | ICD-10-CM

## 2017-01-08 DIAGNOSIS — F53 Postpartum depression: Secondary | ICD-10-CM

## 2017-01-08 NOTE — Progress Notes (Signed)
Postpartum Visit  Chief Complaint:  Chief Complaint  Patient presents with  . Postpartum Care  . Contraception    Nexplanon insert    History of Present Illness: Patient is a 31 y.o. G1P0101 presents for postpartum visit.  Date of delivery: 11/29/2016 Type of delivery: Vaginal delivery - Vacuum or forceps assisted  no Episiotomy No.  Laceration: no  Pregnancy or labor problems:  Yes preterm delivery Any problems since the delivery:  Yes some postpartum depression, partially situational with infant still in NICU  Newborn Details:  SINGLETON :  1. Baby's Birth weight: 3lbs 1oz c/w 1390g Maternal Details:  Breast Feeding:  yes Post partum depression/anxiety noted:  yes Edinburgh Post-Partum Depression Score:  16  Minimal improvement from 17 last visit, but feeling better.  Item 10 still answered as 0 Date of last PAP: 08/14/16 normal   Review of Systems: Review of Systems  Constitutional: Negative for chills, fever and weight loss.  Gastrointestinal: Negative for abdominal pain.  Psychiatric/Behavioral: Positive for depression. Negative for hallucinations, substance abuse and suicidal ideas. The patient is nervous/anxious and has insomnia.     Past Medical History:  Past Medical History:  Diagnosis Date  . Chlamydia   . History of chicken pox   . History of depression   . History of headache   . History of UTI     Past Surgical History:  Past Surgical History:  Procedure Laterality Date  . broken finger  2012    Family History:  Family History  Problem Relation Age of Onset  . Ovarian cysts Mother   . Cancer Paternal Grandmother        Lymphatic Cancer  . Cancer Paternal Uncle        Lymphatic Cancer    Social History:  Social History   Social History  . Marital status: Single    Spouse name: N/A  . Number of children: N/A  . Years of education: N/A   Occupational History  . Not on file.   Social History Main Topics  . Smoking status: Former  Games developer  . Smokeless tobacco: Never Used  . Alcohol use No     Comment: occ  . Drug use: No  . Sexual activity: Yes    Birth control/ protection: None   Other Topics Concern  . Not on file   Social History Narrative  . No narrative on file    Allergies:  No Known Allergies  Medications: Prior to Admission medications   Medication Sig Start Date End Date Taking? Authorizing Provider  buPROPion (WELLBUTRIN XL) 300 MG 24 hr tablet Take 1 tablet (300 mg total) by mouth daily. 12/23/16   Vena Austria, MD  Ferrous Fumarate (HEMOCYTE - 106 MG FE) 324 (106 Fe) MG TABS tablet Take 1 tablet (106 mg of iron total) by mouth 2 (two) times daily. 12/01/16   Conard Novak, MD  hydrOXYzine (ATARAX/VISTARIL) 25 MG tablet Take 1 tablet (25 mg total) by mouth every 6 (six) hours as needed for anxiety. 12/16/16   Vena Austria, MD  ibuprofen (ADVIL,MOTRIN) 600 MG tablet Take 1 tablet (600 mg total) by mouth every 6 (six) hours. 12/01/16   Conard Novak, MD  metoCLOPramide (REGLAN) 10 MG tablet Take 1 tablet (10 mg total) by mouth 3 (three) times daily before meals. 01/05/17 01/25/17  Vena Austria, MD  Prenatal Vit-Fe Fumarate-FA (PRENATAL MULTIVITAMIN) TABS tablet Take 1 tablet by mouth daily at 12 noon.    [provider]    Physical Exam Vitals:  Vitals:   01/08/17 0938  BP: 100/68  Pulse: 74    General: NAD HEENT: normocephalic, anicteric Pulmonary: No increased work of breathing Abdomen: NABS, soft, non-tender, non-distended.  Umbilicus without lesions.  No hepatomegaly, splenomegaly or masses palpable. No evidence of hernia. Genitourinary:  External: Normal external female genitalia.  Normal urethral meatus, normal  Bartholin's and Skene's glands.    Vagina: Normal vaginal mucosa, no evidence of prolapse.    Cervix: Grossly normal in appearance, no bleeding  Uterus: Non-enlarged, mobile, normal contour.  No CMT  Adnexa: ovaries non-enlarged, no adnexal  masses  Rectal: deferred Extremities: no edema, erythema, or tenderness Neurologic: Grossly intact Psychiatric: mood appropriate, affect full        GYNECOLOGY PROCEDURE NOTE  Patient is a 31 y.o. G1P0101 presenting for Nexplanon insertion as her desires means of contraception.  She provided informed consent, signed copy in the chart, time out was performed. Pregnancy test was not obtained, with self reported LMP of Patient's last menstrual period was 12/31/2016 (exact date).  Negative predictive value of 99%-100% - < OR = 7 days from the onset of a normal menstrual cycle - has not had sexual intercourse since the start of the last menstrual cycle - has correctly and consistently used a reliable form of contraception - < or = 7 days from an induced abortion - is within 4 weeks postpartum - is exclusively breast feeding (>85% of feeds), amenorrheic, and <6 months postpartum   "[place] Selected Practice Recommendations for Contraceptive Use", March 08, 2015   She understands that Nexplanon is a progesterone only therapy, and that patients often patients have irregular and unpredictable vaginal bleeding or amenorrhea. She understands that other side effects are possible related to systemic progesterone, including but not limited to, headaches, breast tenderness, nausea, and irritability. While effective at preventing pregnancy long acting reversible contraceptives do not prevent transmission of sexually transmitted diseases and use of barrier methods for this purpose was discussed. The placement procedure for Nexplanon was reviewed with the patient in detail including risks of nerve injury, infection, bleeding and injury to other muscles or tendons. She understands that the Nexplanon implant is good for 3 years and needs to be removed at the end of that time.  She understands that Nexplanon is an extremely effective option for contraception, with failure rate of <1%. This information is reviewed  today and all questions were answered. Informed consent was obtained, both verbally and written.   The patient is healthy and has no contraindications to Implanon use. Urine pregnancy test was performed today and was negative.  Procedure Appropriate time out taken.  Patient placed in dorsal supine with left arm above head, elbow flexed at 90 degrees, arm resting on examination table.  The bicipital grove was palpated and site 8-10cm proximal to the medial epicondyle was indentified . The insertion site was prepped with a two betadine swabs and then injected with 3cc of 1% lidocaine without epinephrine.  Nexplanon removed form sterile blister packaging,  Device confirmed in needle, before inserting full length of needle, tenting up the skin as the needle was advance.  The drug eluting rod was then deployed by pulling back the slider per the manufactures recommendation.  The implant was palpable by the clinician as well as the patient.  The insertion site covered dressed with a band aid before applying  a kerlex bandage pressure dressing..Minimal blood loss was noted during the procedure.  The patientt tolerated  the procedure well.   She was instructed to wear the bandage for 24 hours, call with any signs of infection.  She was given the Implanon card and instructed to have the rod removed in 3 years.  Charge [phone] for nexplanon device, CPT [id number] for procedure   Assessment: 31 y.o. G1P0101 presenting for 6 week postpartum visit  Plan: Problem List Items Addressed This Visit    None    Visit Diagnoses    Postpartum depression    -  Primary   Encounter for postpartum visit       Nexplanon insertion           1) Contraception - nexplanon placed today  2)  Pap - Up to date ASCCP guidelines and rational discussed.    3) Patient underwent screening for postpartum depression.  Continue Welbutrin XL 300mg , will follow up in 6 weeks to see if improvement or worsening with transition of infant  coming home from NICU  4) Continue welbutrin at current dose infqant still in NICU follow up in 6 weeks to see how response to transition to home  5) Follow up 1 year for routine annual exam

## 2017-01-13 ENCOUNTER — Ambulatory Visit: Payer: Self-pay

## 2017-01-13 NOTE — Lactation Note (Signed)
This note was copied from a baby's chart. Lactation Consultation Note  Patient Name: [name] Today's Date: 01/13/2017     Maternal Data  Mom has been trying to keep up with her milk supply for weeks and has tried: fenugreek in the past. Started Reglan 7 days ago and will also use Brewer's yeast and Moringa.   Feeding  Baby was d/c from [facility] on 01/13/17 and wanted to f/u with LC for feedings at the breasts.   LATCH Score/Interventions   Baby received a 9 for latch scoring.                   Lactation Tools Discussed/Used  Nipple [name]    Consult Status  Baby was able to stay latched onto mom's breasts using a 24mm nipple shield with the help from a SNS filled with 1.1065mL of formula. Baby takes a long time to feed, but this is normal. Mom is to continue offering breastfeedings with the use of SNS q4hrs and f/u with LC in 1 week.     Burnadette PeterJaniya M Harding Thomure 01/13/2017, 3:54 PM

## 2017-01-31 NOTE — Progress Notes (Signed)
Intermittent cramping, had some spotting last night. No itching. Mucoid discharge today. Good FM Taking progesterone 100mg m vaginally qhs. Continues on Wellbutrin XL 150 daily-refills called in. Discussed seeing counselor. 28 week labs today/ B POS Cervix unchanged Preterm labor precautions ROB 1 week

## 2017-02-19 ENCOUNTER — Ambulatory Visit: Payer: Medicaid Other | Admitting: Obstetrics and Gynecology

## 2017-02-19 ENCOUNTER — Encounter: Payer: Self-pay | Admitting: Obstetrics and Gynecology

## 2017-02-19 VITALS — BP 110/80 | HR 104 | Wt 212.0 lb

## 2017-02-27 NOTE — Progress Notes (Signed)
Left prior to being seen. 

## 2017-03-05 ENCOUNTER — Encounter: Payer: Self-pay | Admitting: Advanced Practice Midwife

## 2017-03-05 ENCOUNTER — Ambulatory Visit (INDEPENDENT_AMBULATORY_CARE_PROVIDER_SITE_OTHER): Payer: Medicaid Other | Admitting: Advanced Practice Midwife

## 2017-03-05 VITALS — Ht 64.0 in | Wt 216.0 lb

## 2017-03-05 DIAGNOSIS — F53 Postpartum depression: Secondary | ICD-10-CM

## 2017-03-05 DIAGNOSIS — O924 Hypogalactia: Secondary | ICD-10-CM | POA: Diagnosis not present

## 2017-03-05 DIAGNOSIS — O99345 Other mental disorders complicating the puerperium: Principal | ICD-10-CM

## 2017-03-05 MED ORDER — HYDROXYZINE HCL 25 MG PO TABS
25.0000 mg | ORAL_TABLET | Freq: Four times a day (QID) | ORAL | 2 refills | Status: AC | PRN
Start: 2017-03-05 — End: ?

## 2017-03-05 MED ORDER — BUPROPION HCL ER (XL) 300 MG PO TB24: 300.0000 mg | ORAL_TABLET | Freq: Every day | ORAL | 8 refills | Status: DC

## 2017-03-05 MED ORDER — METOCLOPRAMIDE HCL 10 MG PO TABS: 10.0000 mg | ORAL_TABLET | Freq: Three times a day (TID) | ORAL | 2 refills | Status: DC

## 2017-03-05 NOTE — Progress Notes (Signed)
S: Patient is here for medication follow up visit. She needs a refill on 3 of her medications. She says she has not been taking the Wellbutrin every day but says it helps when she does. She says the Reglan has helped with her milk production. She still has some issues with depression and anxiety. She is encouraged to rely on her support system and get regular sleep. She says the baby is doing well without any known sequelae from her preterm birth.  O: Vital Signs: Ht 5\' 4"  (1.626 m)   Wt 216 lb (98 kg)   BMI 37.08 kg/m  Constitutional: Well nourished, well developed female in no acute distress.  HEENT: normal Skin: Warm and dry.  Cardiovascular: Regular rate and rhythm.   Respiratory: Clear to auscultation bilateral. Normal respiratory effort Psych: Alert and Oriented x3. No memory deficits. Normal mood and affect.  MS: normal gait, normal bilateral lower extremity ROM/strength/stability.  A: 31 y.o. Female with postpartum depression, decreased lactation  P: Refill of Wellbutrin- take daily to maintain a steady state Refill of Atarax- decrease use as able for improved milk supply Refill of Reglan to encourage milk production  Tresea MallJane Deyanna Mctier, CNM

## 2017-08-17 ENCOUNTER — Other Ambulatory Visit: Payer: Self-pay | Admitting: Podiatry

## 2017-08-17 ENCOUNTER — Ambulatory Visit (INDEPENDENT_AMBULATORY_CARE_PROVIDER_SITE_OTHER): Payer: Managed Care, Other (non HMO)

## 2017-08-17 ENCOUNTER — Ambulatory Visit: Payer: Managed Care, Other (non HMO) | Admitting: Podiatry

## 2017-08-17 ENCOUNTER — Encounter: Payer: Self-pay | Admitting: Podiatry

## 2017-08-17 DIAGNOSIS — M7672 Peroneal tendinitis, left leg: Secondary | ICD-10-CM

## 2017-08-17 DIAGNOSIS — M79672 Pain in left foot: Secondary | ICD-10-CM

## 2017-08-17 MED ORDER — MELOXICAM 15 MG PO TABS: 15.0000 mg | ORAL_TABLET | Freq: Every day | ORAL | 1 refills | Status: AC

## 2017-08-17 MED ORDER — METHYLPREDNISOLONE 4 MG PO TBPK: ORAL_TABLET | ORAL | 0 refills | Status: AC

## 2017-08-17 NOTE — Progress Notes (Signed)
   Subjective:    Patient ID: Janice Goodman, female    DOB: [birth year], 32 y.o.   MRN: [id number]  HPI    Review of Systems     Objective:   Physical Exam        Assessment & Plan:

## 2017-08-18 NOTE — Progress Notes (Signed)
   HPI: [age] year old female presenting today as a new patient with a complaint of sharp, shooting pain to the lateral side and dorsum of the left foot that began approximately 2 years ago. She reports associated swelling to the areas. She reports a previous injury to the left foot stating she dropped a crate of something on the foot 2 years ago.  Wearing shoes, walking and bending the toes increase the pain.  There are no alleviating factors noted.  She has been wearing compression socks and a cam boot, icing the area and taking ibuprofen for treatment with no significant relief.  Patient is here for further evaluation and treatment.   Past Medical History:  Diagnosis Date  . Chlamydia   . History of chicken pox   . History of depression   . History of headache   . History of UTI      Physical Exam: General: The patient is alert and oriented x3 in no acute distress.  Dermatology: Skin is warm, dry and supple bilateral lower extremities. Negative for open lesions or macerations.  Vascular: Palpable pedal pulses bilaterally. No edema or erythema noted. Capillary refill within normal limits.  Neurological: Epicritic and protective threshold grossly intact bilaterally.   Musculoskeletal Exam: Pain with palpation to the peroneal tendons of the left foot.  Range of motion within normal limits to all pedal and ankle joints bilateral. Muscle strength 5/5 in all groups bilateral.   Radiographic Exam:  Normal osseous mineralization. Joint spaces preserved. No fracture/dislocation/boney destruction.    Assessment: -Insertional peroneal tendinitis left   Plan of Care:  -Patient evaluated.  X-rays reviewed. -Injection of 0.5 mLs Celestone Soluspan injected into the insertion of the peroneal tendons of the left foot.  Care was taken to avoid direct injection into the tendon. -Prescription for Medrol Dosepak provided to patient. -Prescription for Mobic provided to patient. -Recommended  weightbearing in cam boot for 4 weeks.  Patient has cam boot at home. -Return to clinic in 4 weeks.  If not better, will order an MRI.   Felecia ShellingBrent M. Evans, DPM [facility]  Dr. Felecia ShellingBrent M. Evans, DPM    [address]                                        [place], [place] [address]                Office [phone]) [phone]  Fax [phone](336) [phone]

## 2017-09-10 ENCOUNTER — Ambulatory Visit: Payer: Managed Care, Other (non HMO) | Admitting: Podiatry

## 2017-09-14 ENCOUNTER — Ambulatory Visit: Payer: Managed Care, Other (non HMO) | Admitting: Podiatry

## 2017-09-14 ENCOUNTER — Encounter: Payer: Self-pay | Admitting: Podiatry

## 2017-09-14 DIAGNOSIS — M7672 Peroneal tendinitis, left leg: Secondary | ICD-10-CM

## 2017-09-16 ENCOUNTER — Telehealth: Payer: Self-pay | Admitting: *Deleted

## 2017-09-16 ENCOUNTER — Other Ambulatory Visit: Payer: Self-pay

## 2017-09-16 DIAGNOSIS — T148XXA Other injury of unspecified body region, initial encounter: Secondary | ICD-10-CM

## 2017-09-16 DIAGNOSIS — M7672 Peroneal tendinitis, left leg: Secondary | ICD-10-CM

## 2017-09-16 NOTE — Telephone Encounter (Addendum)
[employer] requires clinicals prior to pre-cert for [id number] MRI left foot with or without contrast, Case# [id number].

## 2017-09-17 NOTE — Progress Notes (Signed)
   HPI: 32 year old female presenting today for follow up evaluation of left insertional peroneal tendinitis. She states the pain has not changed since her previous appointment. She has taken the Medrol Dose Pak as directed with no significant relief. She states the injection provided some relief but for only about 1.5 weeks. She states the Mobic helps with the swelling. Patient is here for further evaluation and treatment.   Past Medical History:  Diagnosis Date  . Chlamydia   . History of chicken pox   . History of depression   . History of headache   . History of UTI      Physical Exam: General: The patient is alert and oriented x3 in no acute distress.  Dermatology: Skin is warm, dry and supple bilateral lower extremities. Negative for open lesions or macerations.  Vascular: Palpable pedal pulses bilaterally. No edema or erythema noted. Capillary refill within normal limits.  Neurological: Epicritic and protective threshold grossly intact bilaterally.   Musculoskeletal Exam: Pain with palpation to the peroneal tendons of the left foot.  Range of motion within normal limits to all pedal and ankle joints bilateral. Muscle strength 5/5 in all groups bilateral.   Assessment: -Insertional peroneal tendinitis left - unchanged   Plan of Care:  - Patient evaluated.   - Orders for MRI of the left foot placed. - Compression anklet dispensed. Continue weightbearing in CAM boot. - Continue taking Mobic 15 mg as directed. - Return to clinic in 4 weeks.    Felecia ShellingBrent M. Joshau Code, DPM [facility]  Dr. Felecia ShellingBrent M. Javan Gonzaga, DPM    [address]                                        [place], [place] [address]                Office [phone](336) [phone]  Fax [phone](336) [phone]

## 2017-09-21 NOTE — Telephone Encounter (Signed)
Wendie Chessigna - [employer] required clinicals for review for pre-cert, faxed to [employer].

## 2017-09-21 NOTE — Telephone Encounter (Addendum)
[employer] denied the MRI [id number]. Dr. Logan BoresEvans may request review of denial [phone], Customer ID#:  [id number], Reference Code:  [id number].

## 2017-09-24 ENCOUNTER — Telehealth: Payer: Self-pay | Admitting: Podiatry

## 2017-09-24 NOTE — Telephone Encounter (Signed)
Patient called about the status of her MRI scheduling. Advised her that Joya SanValery was taking care of that in Angie's absence but per notes it looked like insurance had denied the MRI request and that in order to get this approved Dr. Logan BoresEvans would have to do a letter of appeal.  Patient states she is changing jobs next Friday Feb 22 and her coverage will end with her current job and then she is not sure when her new insurance will go into effect yet. She will call back to advise once she starts her new job. No appeal needed at this point. Hold off on scheduling until she notifies us.

## 2017-09-28 ENCOUNTER — Ambulatory Visit (INDEPENDENT_AMBULATORY_CARE_PROVIDER_SITE_OTHER): Payer: Managed Care, Other (non HMO) | Admitting: Podiatry

## 2017-09-28 ENCOUNTER — Encounter: Payer: Self-pay | Admitting: Podiatry

## 2017-09-28 DIAGNOSIS — M7672 Peroneal tendinitis, left leg: Secondary | ICD-10-CM | POA: Diagnosis not present

## 2017-09-30 NOTE — Progress Notes (Signed)
   HPI: 32 year old female presenting today for follow up evaluation of left insertional peroneal tendinitis. She states the pain has not changed since her previous appointment. She has not been able to have her MRI yet due to a change in her insurance. She states the injections helped alleviate the pain for about two weeks prior to returning. Patient is here for further evaluation and treatment.   Past Medical History:  Diagnosis Date  . Chlamydia   . History of chicken pox   . History of depression   . History of headache   . History of UTI      Physical Exam: General: The patient is alert and oriented x3 in no acute distress.  Dermatology: Skin is warm, dry and supple bilateral lower extremities. Negative for open lesions or macerations.  Vascular: Palpable pedal pulses bilaterally. No edema or erythema noted. Capillary refill within normal limits.  Neurological: Epicritic and protective threshold grossly intact bilaterally.   Musculoskeletal Exam: Pain with palpation to the peroneal tendons of the left foot.  Range of motion within normal limits to all pedal and ankle joints bilateral. Muscle strength 5/5 in all groups bilateral.   Assessment: -Insertional peroneal tendinitis left - unchanged   Plan of Care:  - Patient evaluated.   - Injection of 0.5 mLs Celestone Soluspan injected into the peroneal tendon sheath of the left foot. Care was taken to avoid direct injection into the tendons.  - Continue wearing compression anklet.  - Patient starting a new job with new health insurance.  - MRI is on hold until patient gets her new insurance.  - Return to clinic as needed.   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    Felecia ShellingBrent M. Evans, DPM [facility]  Dr. Felecia ShellingBrent M. Evans, DPM    [address]                                        [place], [place] [address]                Office [phone]  Fax [phone]

## 2017-10-15 ENCOUNTER — Ambulatory Visit: Payer: Managed Care, Other (non HMO) | Admitting: Podiatry

## 2017-12-13 ENCOUNTER — Other Ambulatory Visit: Payer: Self-pay | Admitting: Obstetrics and Gynecology

## 2017-12-13 DIAGNOSIS — F53 Postpartum depression: Secondary | ICD-10-CM

## 2017-12-13 DIAGNOSIS — O99345 Other mental disorders complicating the puerperium: Principal | ICD-10-CM

## 2018-02-25 ENCOUNTER — Other Ambulatory Visit: Payer: Self-pay

## 2018-02-25 DIAGNOSIS — F53 Postpartum depression: Secondary | ICD-10-CM

## 2018-02-25 DIAGNOSIS — O99345 Other mental disorders complicating the puerperium: Principal | ICD-10-CM

## 2018-02-25 MED ORDER — MELOXICAM 15 MG PO TABS: 15.0000 mg | ORAL_TABLET | Freq: Every day | ORAL | 3 refills | Status: AC

## 2018-02-25 NOTE — Telephone Encounter (Signed)
Pharmacy refill request for Meloxicam.  Per Dr. Evans verbal order, ok to refill.  Script has been sent to pharmacy 

## 2018-02-25 NOTE — Progress Notes (Signed)
Opened in error

## 2018-05-12 ENCOUNTER — Other Ambulatory Visit: Payer: Self-pay | Admitting: Obstetrics and Gynecology

## 2018-05-12 DIAGNOSIS — F53 Postpartum depression: Secondary | ICD-10-CM

## 2018-05-12 DIAGNOSIS — O99345 Other mental disorders complicating the puerperium: Principal | ICD-10-CM

## 2018-08-13 IMAGING — US US OB TRANSVAGINAL
1 series · 14 of 28 positions shown · non-contrast
Comparison: 06/10/2012

CLINICAL DATA: Vaginal bleeding and first-trimester pregnancy.

EXAM:
OBSTETRIC <14 WK US AND TRANSVAGINAL OB US
TECHNIQUE: Both transabdominal and transvaginal ultrasound examinations were
performed for complete evaluation of the gestation as well as the
maternal uterus, adnexal regions, and pelvic cul-de-sac.
Transvaginal technique was performed to assess early pregnancy.

[Series 1: us ob transvaginal · 0.23mm/px · 14 of 101 slices shown]
[im 4/101]
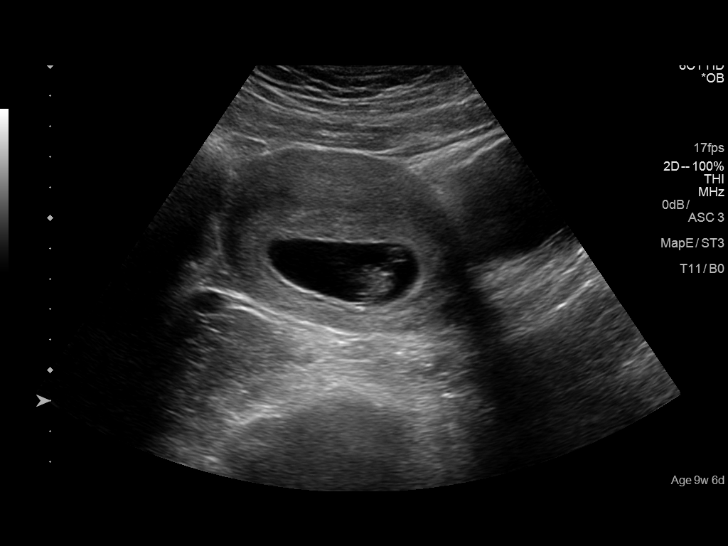
[im 12/101]
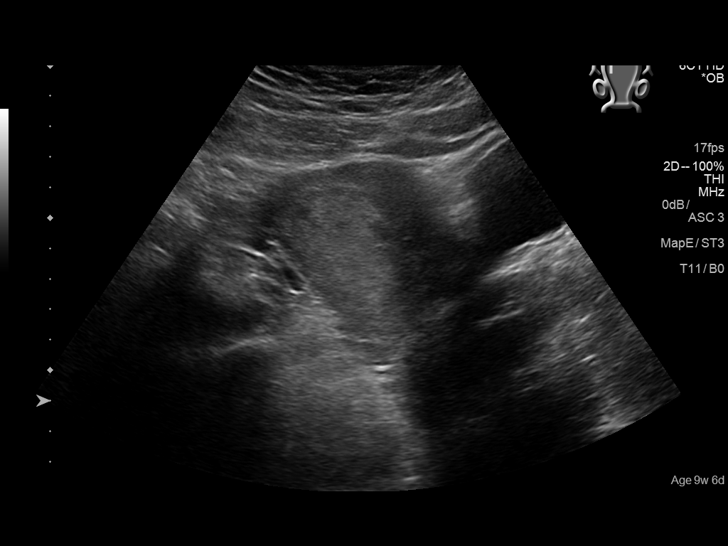
[im 19/101]
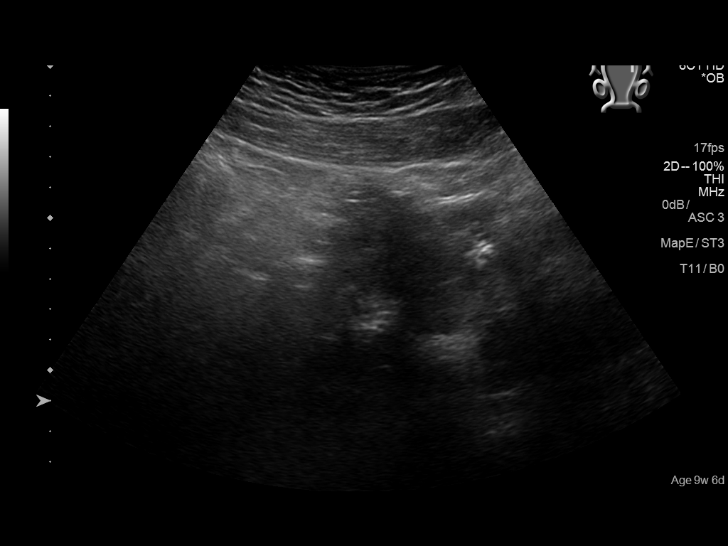
[im 26/101]
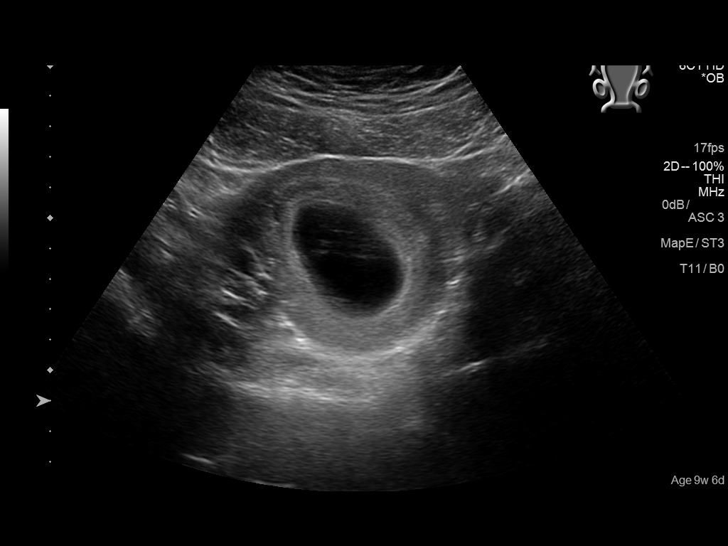
[im 34/101]
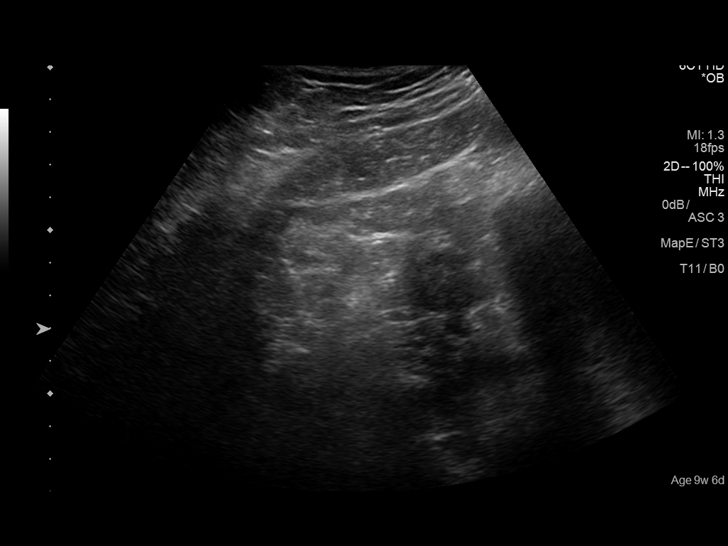
[im 41/101]
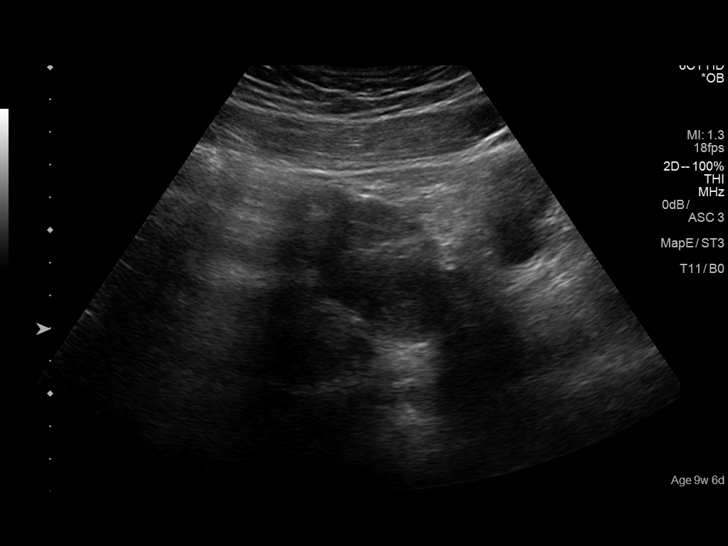
[im 49/101]
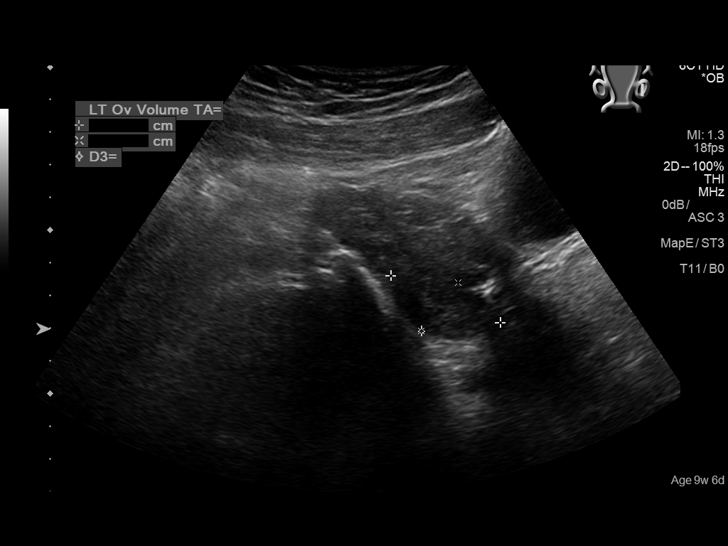
[im 56/101]
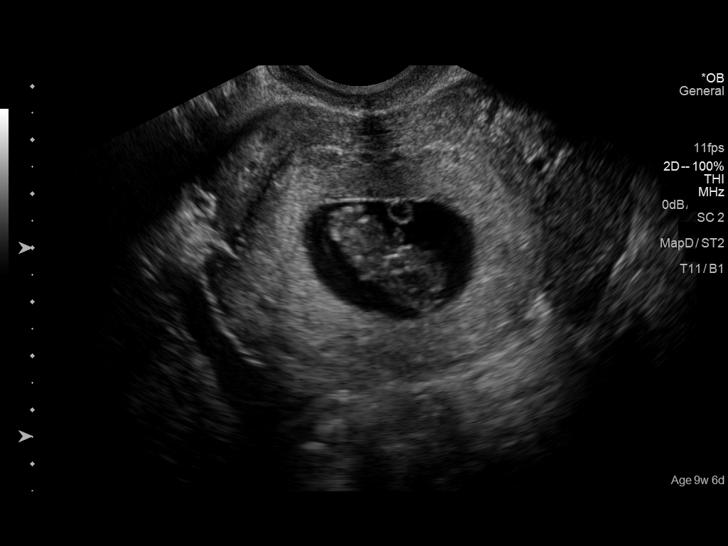
[im 63/101]
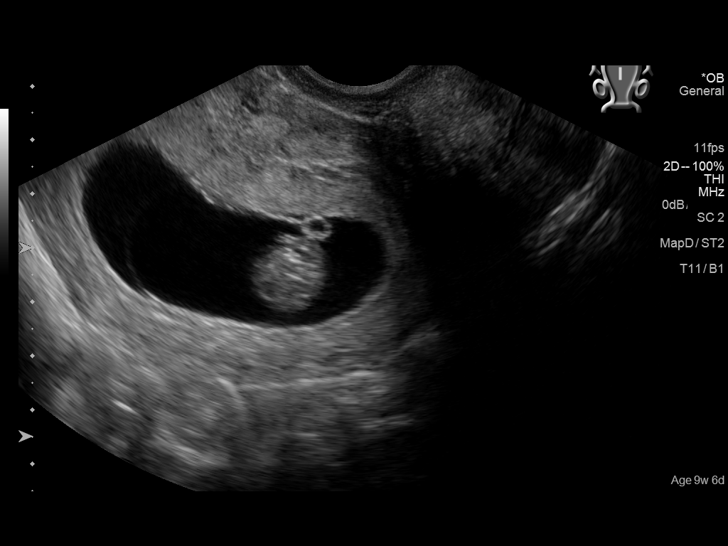
[im 71/101]
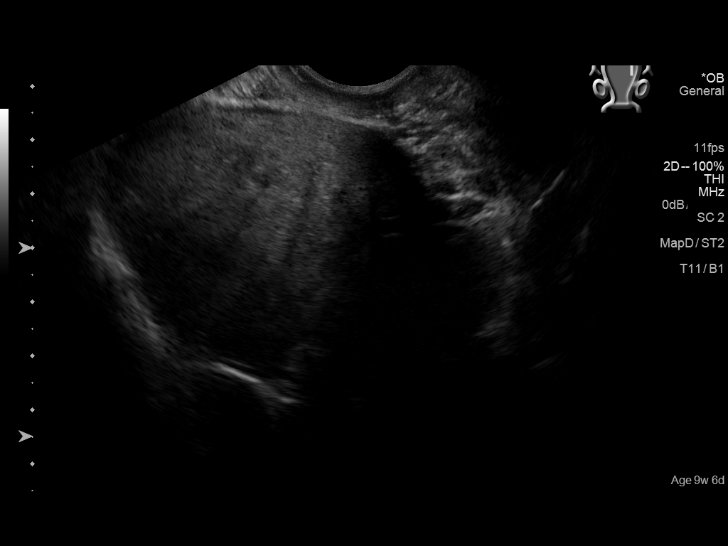
[im 78/101]
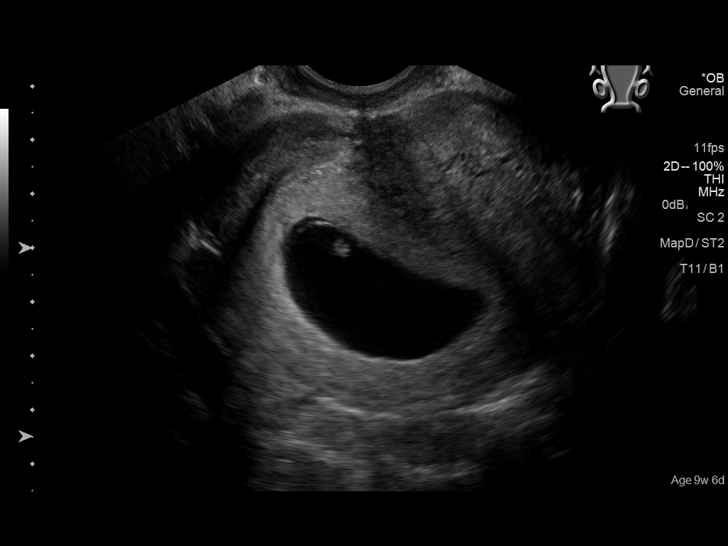
[im 86/101]
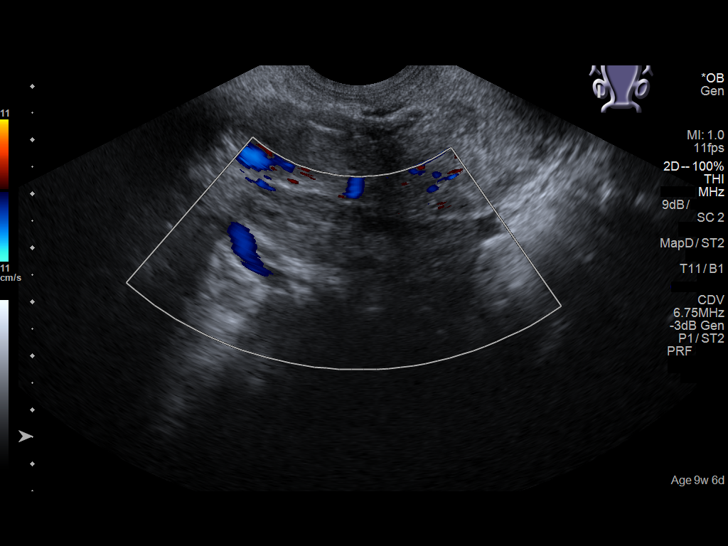
[im 93/101]
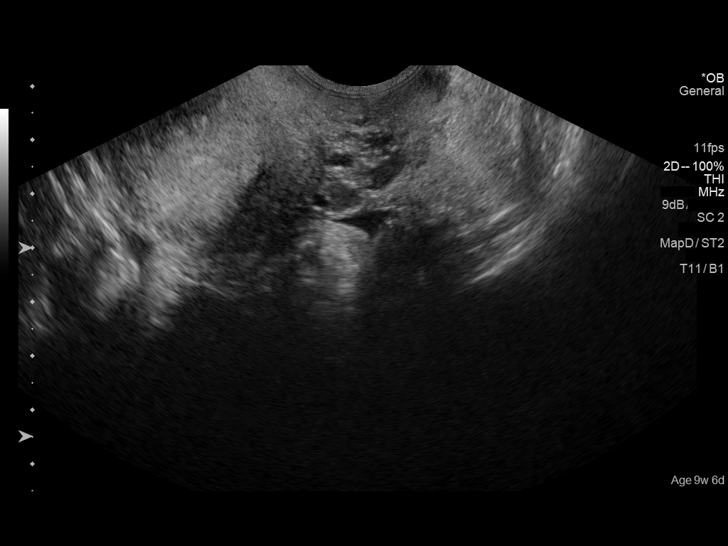
[im 101/101]
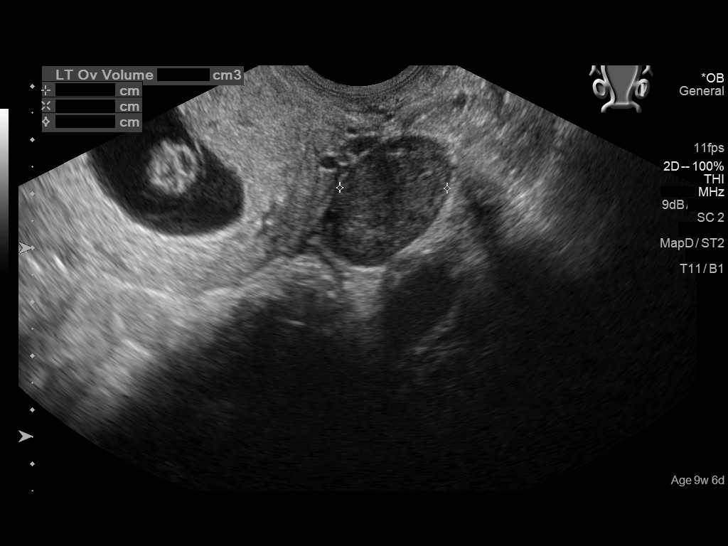

[14 of 28 positions shown; findings below may reference images not displayed]

FINDINGS: Intrauterine gestational sac: Single

Yolk sac:  Visualized.

Embryo:  Visualized.

Cardiac Activity: Visualized.

Heart Rate: 171  bpm

CRL:  26.6  Mm   9 w   3 d                  US EDC: 02/05/2017

Subchorionic hemorrhage:  None visualized.

Maternal uterus/adnexae: Corpus luteum on the left. No pathologic
finding.

Small luminal ridge seen in the right bladder base, likely also
present in 0560 and related to ureter entry. No typical ureterocele
appearance.
IMPRESSION: Single living intrauterine pregnancy measuring 9 weeks 3 days. No
adverse finding.

## 2019-01-01 IMAGING — US US OB LIMITED
1 series · 14 of 28 positions shown · non-contrast
Comparison: 07/06/2016

CLINICAL DATA: 30-year-old female with vaginal bleeding in the
third trimester of pregnancy. Estimated gestational age by prior
ultrasound 29 weeks 4 days

EXAM:
LIMITED OBSTETRIC ULTRASOUND

[Series 1: us ob limited · 0.23mm/px · 14 of 35 slices shown]
[im 2/35]
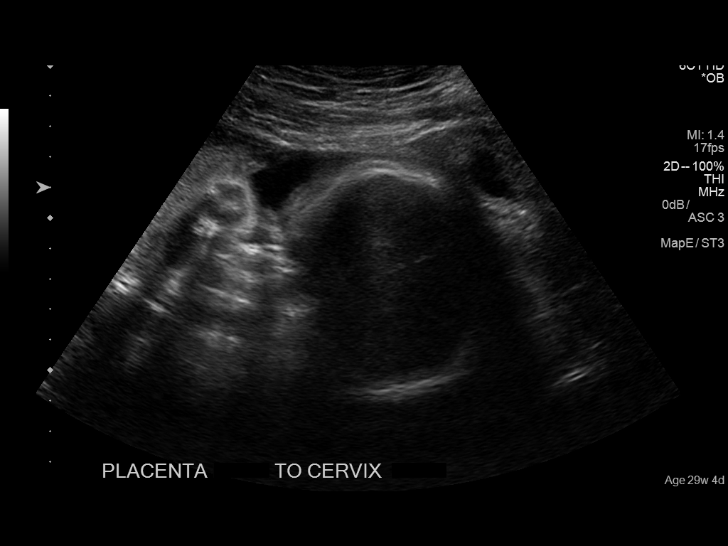
[im 4/35]
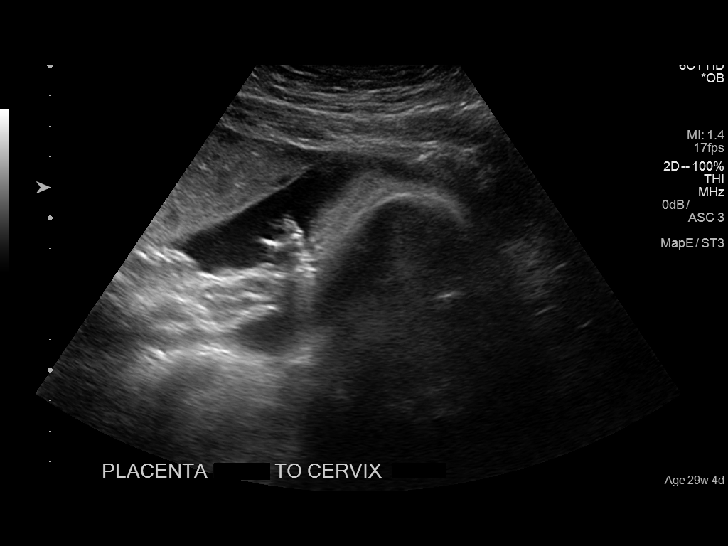
[im 7/35]
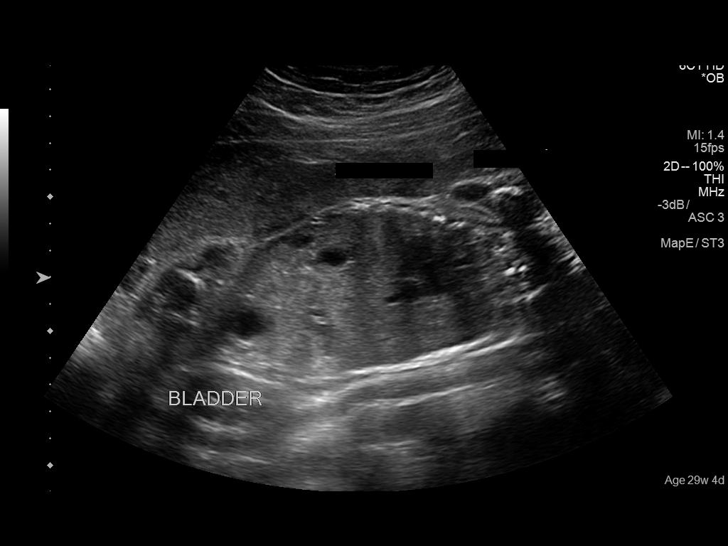
[im 9/35]
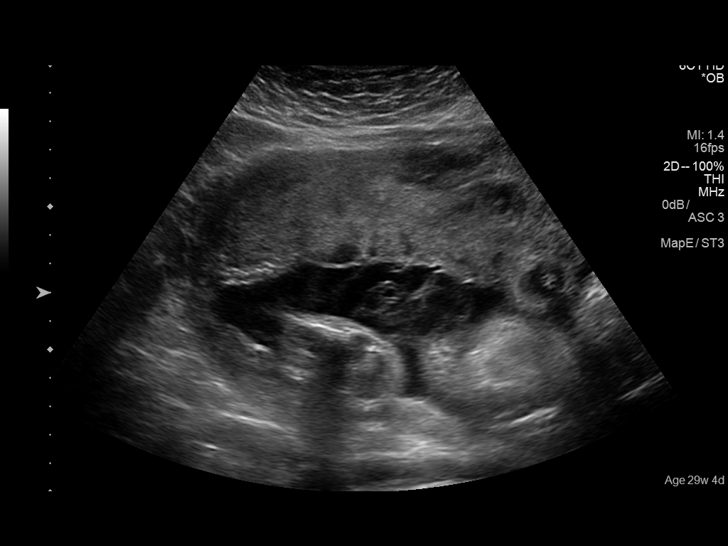
[im 12/35]
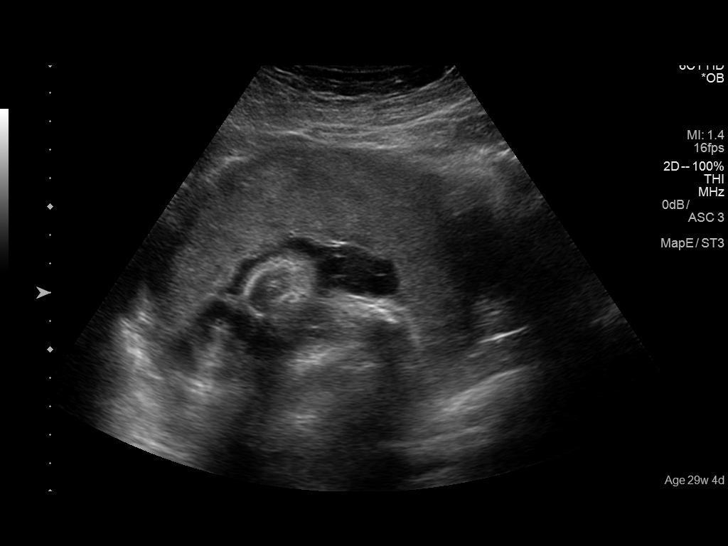
[im 14/35]
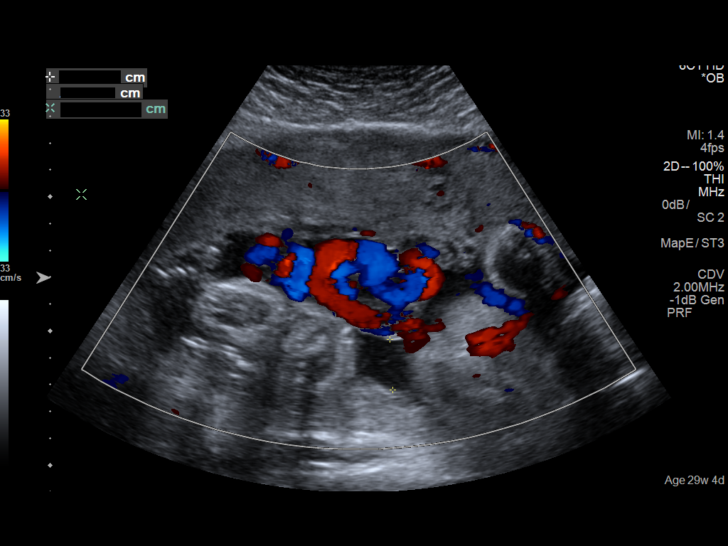
[im 17/35]
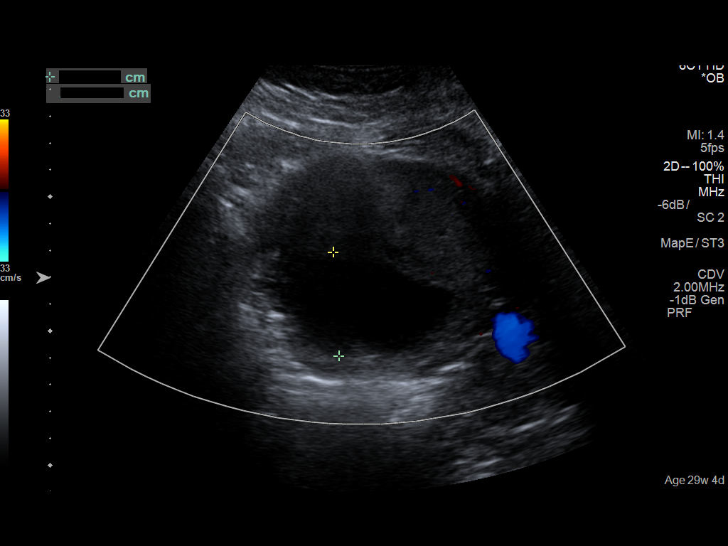
[im 19/35]
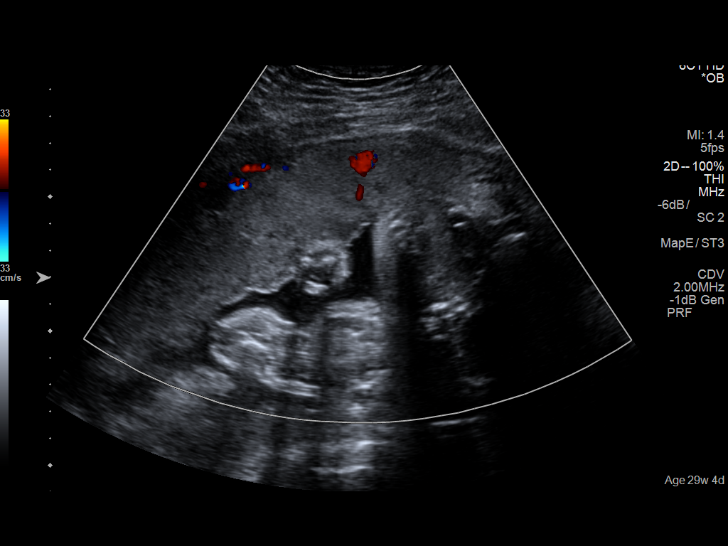
[im 22/35]
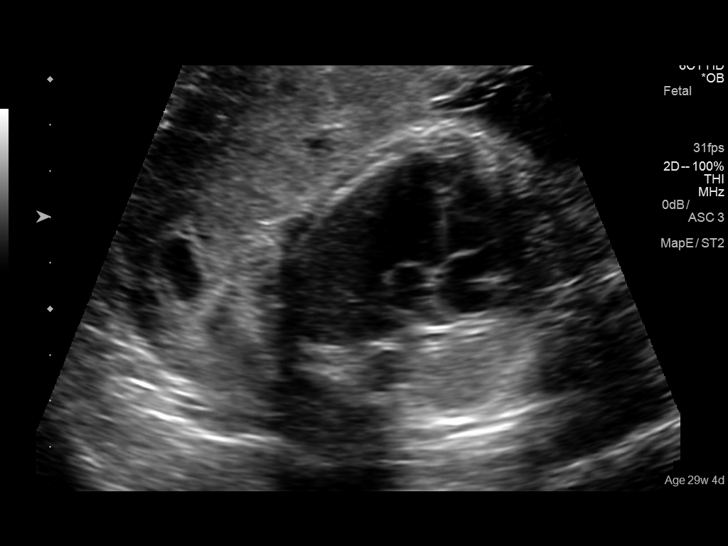
[im 24/35]
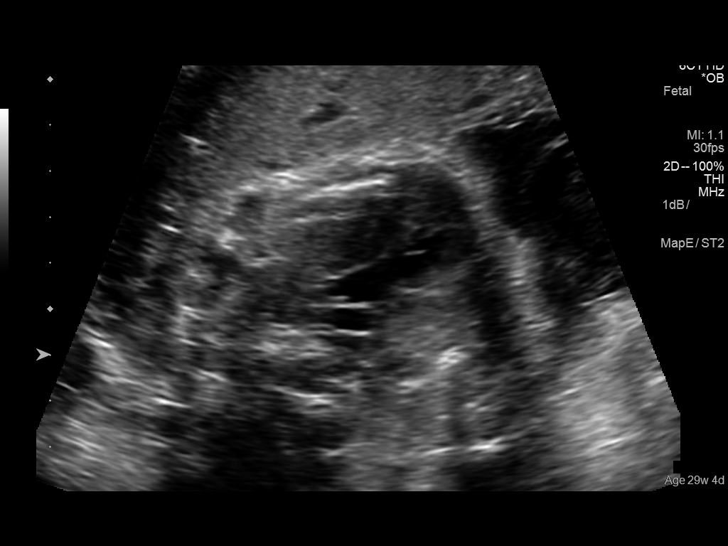
[im 27/35]
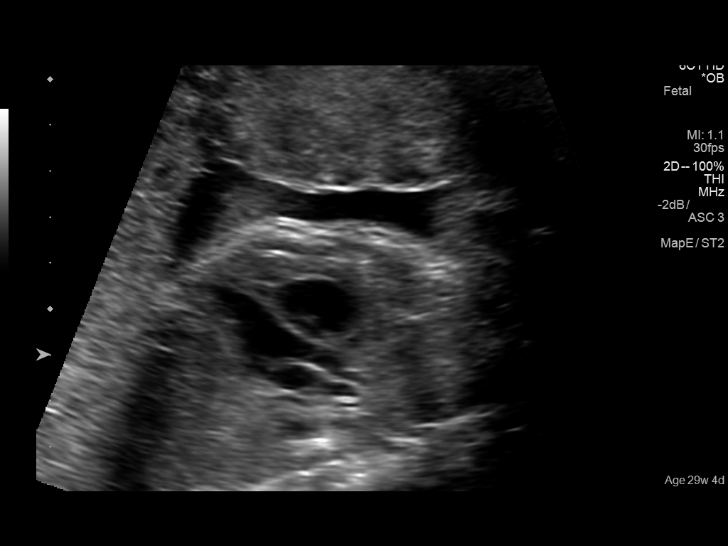
[im 29/35]
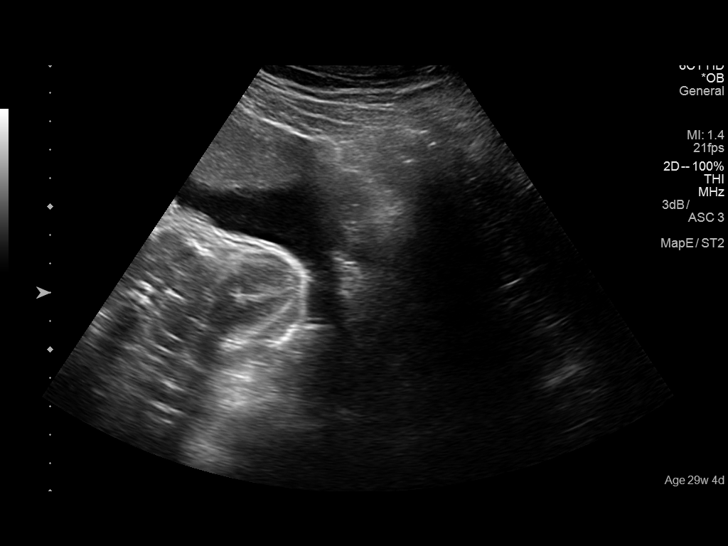
[im 32/35]
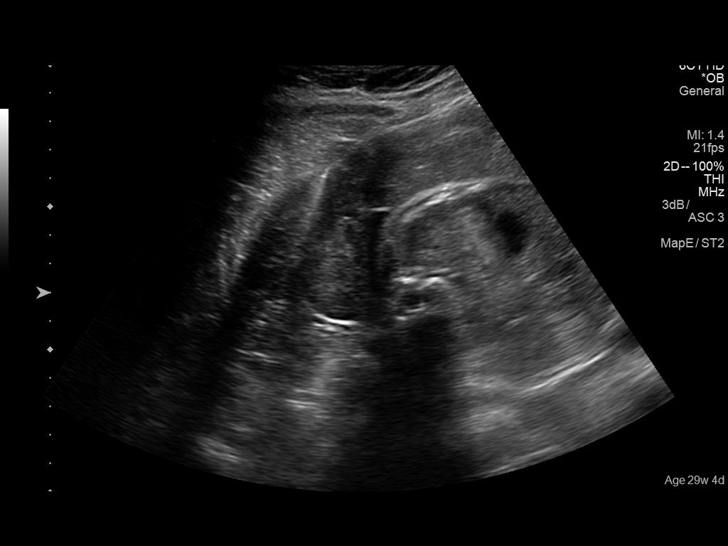
[im 35/35]
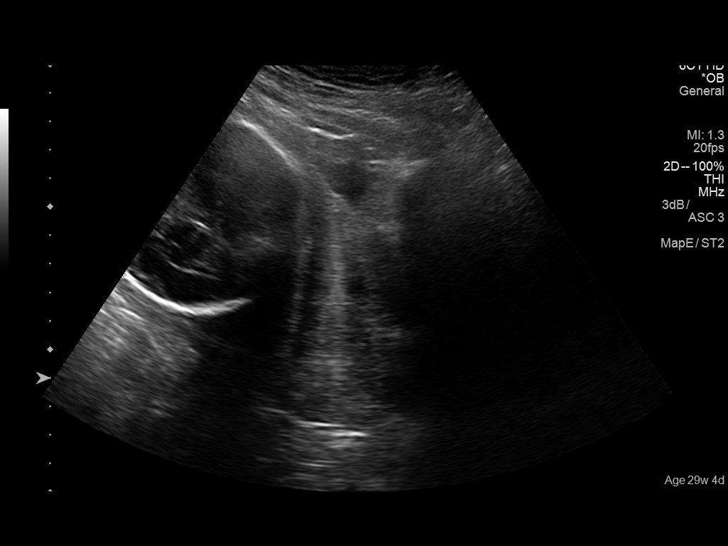

[14 of 28 positions shown; findings below may reference images not displayed]

FINDINGS: Number of Fetuses: 1

Heart Rate:  145 bpm

Movement: Present

Presentation: Cephalic

Placental Location: Anterior

Previa: None

Amniotic Fluid (Subjective):  Within normal limits.

BPD:  7.2cm 29w  0d

MATERNAL FINDINGS:

Cervix:  Appears closed.

Uterus/Adnexae:  No abnormality visualized.
IMPRESSION: Single living fetus demonstrated with estimated gestational age of
29 weeks 0 days. No acute maternal findings visualized.

This exam does not comprehensively evaluate fetal size, dating, or
anatomy; follow-up complete OB US should be considered if further
fetal assessment is warranted.

## 2019-09-12 ENCOUNTER — Encounter: Payer: Self-pay | Admitting: Family Medicine
